# Patient Record
Sex: Female | Born: 1990 | Hispanic: Yes | Marital: Single | Smoking: Never smoker
Health system: Southern US, Community
[De-identification: ages and names within clinical notes are randomized; demographics above are authoritative.]

## PROBLEM LIST (undated history)

## (undated) DIAGNOSIS — Z803 Family history of malignant neoplasm of breast: Secondary | ICD-10-CM

## (undated) DIAGNOSIS — Z789 Other specified health status: Secondary | ICD-10-CM

## (undated) DIAGNOSIS — D649 Anemia, unspecified: Secondary | ICD-10-CM

## (undated) HISTORY — PX: NO PAST SURGERIES: SHX2092

## (undated) HISTORY — DX: Family history of malignant neoplasm of breast: Z80.3

## (undated) HISTORY — DX: Anemia, unspecified: D64.9

## (undated) HISTORY — PX: CHOLECYSTECTOMY: SHX55

---

## 2012-09-28 ENCOUNTER — Inpatient Hospital Stay (HOSPITAL_COMMUNITY): Admission: AD | Admit: 2012-09-28 | Payer: Self-pay | Source: Ambulatory Visit | Admitting: Obstetrics & Gynecology

## 2013-08-15 NOTE — L&D Delivery Note (Signed)
Delivery Note At 1:08 PM a viable and healthy female was delivered via Vaginal, Spontaneous Delivery (Presentation: Left Occiput Anterior).  APGAR: 9, 9; weight  pending.   Placenta status: Intact, Spontaneous.  Cord: 3 vessels with a loose nuchal x 1 reduced on perineum  Anesthesia: None  Episiotomy: None Lacerations: None Suture Repair: None Est. Blood Loss (mL):  250cc  Mom to postpartum.  Baby to Couplet care / Skin to Skin.  Careena Degraffenreid H. 07/31/2014, 1:23 PM

## 2014-07-01 LAB — OB RESULTS CONSOLE GBS: GBS: POSITIVE

## 2014-07-31 ENCOUNTER — Inpatient Hospital Stay (HOSPITAL_COMMUNITY)
Admission: AD | Admit: 2014-07-31 | Discharge: 2014-08-02 | DRG: 775 | Disposition: A | Discharge status: Home / Self Care | Payer: Medicaid Other | Source: Ambulatory Visit | Attending: Obstetrics and Gynecology | Admitting: Obstetrics and Gynecology

## 2014-07-31 ENCOUNTER — Encounter (HOSPITAL_COMMUNITY): Payer: Self-pay | Admitting: *Deleted

## 2014-07-31 DIAGNOSIS — O99824 Streptococcus B carrier state complicating childbirth: Principal | ICD-10-CM | POA: Diagnosis present

## 2014-07-31 DIAGNOSIS — O4290 Premature rupture of membranes, unspecified as to length of time between rupture and onset of labor, unspecified weeks of gestation: Secondary | ICD-10-CM | POA: Diagnosis present

## 2014-07-31 DIAGNOSIS — Z3A39 39 weeks gestation of pregnancy: Secondary | ICD-10-CM | POA: Diagnosis present

## 2014-07-31 DIAGNOSIS — Z3483 Encounter for supervision of other normal pregnancy, third trimester: Secondary | ICD-10-CM | POA: Diagnosis present

## 2014-07-31 HISTORY — DX: Other specified health status: Z78.9

## 2014-07-31 LAB — TYPE AND SCREEN
ABO/RH(D): O POS
Antibody Screen: NEGATIVE

## 2014-07-31 LAB — CBC
HCT: 32.9 % — ABNORMAL LOW (ref 36.0–46.0)
HEMOGLOBIN: 11.3 g/dL — AB (ref 12.0–15.0)
MCH: 29.6 pg (ref 26.0–34.0)
MCHC: 34.3 g/dL (ref 30.0–36.0)
MCV: 86.1 fL (ref 78.0–100.0)
Platelets: 264 10*3/uL (ref 150–400)
RBC: 3.82 MIL/uL — AB (ref 3.87–5.11)
RDW: 14 % (ref 11.5–15.5)
WBC: 7.9 10*3/uL (ref 4.0–10.5)

## 2014-07-31 LAB — OB RESULTS CONSOLE RUBELLA ANTIBODY, IGM: Rubella: IMMUNE

## 2014-07-31 LAB — OB RESULTS CONSOLE ABO/RH: RH TYPE: POSITIVE

## 2014-07-31 LAB — OB RESULTS CONSOLE ANTIBODY SCREEN: Antibody Screen: NEGATIVE

## 2014-07-31 LAB — OB RESULTS CONSOLE RPR: RPR: NONREACTIVE

## 2014-07-31 LAB — RPR

## 2014-07-31 LAB — POCT FERN TEST: POCT FERN TEST: POSITIVE

## 2014-07-31 LAB — OB RESULTS CONSOLE GC/CHLAMYDIA
Chlamydia: NEGATIVE
GC PROBE AMP, GENITAL: NEGATIVE

## 2014-07-31 LAB — ABO/RH: ABO/RH(D): O POS

## 2014-07-31 LAB — OB RESULTS CONSOLE HIV ANTIBODY (ROUTINE TESTING): HIV: NONREACTIVE

## 2014-07-31 LAB — OB RESULTS CONSOLE HEPATITIS B SURFACE ANTIGEN: Hepatitis B Surface Ag: NEGATIVE

## 2014-07-31 MED ORDER — CITRIC ACID-SODIUM CITRATE 334-500 MG/5ML PO SOLN: 30.0000 mL | ORAL | Status: DC | PRN

## 2014-07-31 MED ORDER — ONDANSETRON HCL 4 MG PO TABS: 4.0000 mg | ORAL_TABLET | ORAL | Status: DC | PRN

## 2014-07-31 MED ORDER — FENTANYL 2.5 MCG/ML BUPIVACAINE 1/10 % EPIDURAL INFUSION (WH - ANES): 14.0000 mL/h | INTRAMUSCULAR | Status: DC | PRN

## 2014-07-31 MED ORDER — OXYCODONE-ACETAMINOPHEN 5-325 MG PO TABS
2.0000 | ORAL_TABLET | ORAL | Status: DC | PRN
Start: 2014-07-31 — End: 2014-08-02

## 2014-07-31 MED ORDER — WITCH HAZEL-GLYCERIN EX PADS: 1.0000 "application " | MEDICATED_PAD | CUTANEOUS | Status: DC | PRN

## 2014-07-31 MED ORDER — OXYCODONE-ACETAMINOPHEN 5-325 MG PO TABS: 1.0000 | ORAL_TABLET | ORAL | Status: DC | PRN

## 2014-07-31 MED ORDER — SENNOSIDES-DOCUSATE SODIUM 8.6-50 MG PO TABS
2.0000 | ORAL_TABLET | ORAL | Status: DC
  Administered 2014-08-01 (×2): 2 via ORAL
  Filled 2014-07-31 (×2): qty 2

## 2014-07-31 MED ORDER — BUTORPHANOL TARTRATE 1 MG/ML IJ SOLN
1.0000 mg | INTRAMUSCULAR | Status: DC | PRN
  Administered 2014-07-31: 1 mg via INTRAVENOUS
  Filled 2014-07-31: qty 1

## 2014-07-31 MED ORDER — OXYTOCIN 40 UNITS IN LACTATED RINGERS INFUSION - SIMPLE MED: 62.5000 mL/h | INTRAVENOUS | Status: DC

## 2014-07-31 MED ORDER — OXYTOCIN 40 UNITS IN LACTATED RINGERS INFUSION - SIMPLE MED
1.0000 m[IU]/min | INTRAVENOUS | Status: DC
  Administered 2014-07-31: 2 m[IU]/min via INTRAVENOUS
  Administered 2014-07-31: 666 m[IU]/min via INTRAVENOUS
  Filled 2014-07-31: qty 1000

## 2014-07-31 MED ORDER — DIPHENHYDRAMINE HCL 25 MG PO CAPS: 25.0000 mg | ORAL_CAPSULE | Freq: Four times a day (QID) | ORAL | Status: DC | PRN

## 2014-07-31 MED ORDER — EPHEDRINE 5 MG/ML INJ
10.0000 mg | INTRAVENOUS | Status: DC | PRN
  Filled 2014-07-31: qty 2

## 2014-07-31 MED ORDER — ZOLPIDEM TARTRATE 5 MG PO TABS: 5.0000 mg | ORAL_TABLET | Freq: Every evening | ORAL | Status: DC | PRN

## 2014-07-31 MED ORDER — BENZOCAINE-MENTHOL 20-0.5 % EX AERO: 1.0000 "application " | INHALATION_SPRAY | CUTANEOUS | Status: DC | PRN

## 2014-07-31 MED ORDER — ONDANSETRON HCL 4 MG/2ML IJ SOLN: 4.0000 mg | Freq: Four times a day (QID) | INTRAMUSCULAR | Status: DC | PRN

## 2014-07-31 MED ORDER — ONDANSETRON HCL 4 MG/2ML IJ SOLN: 4.0000 mg | INTRAMUSCULAR | Status: DC | PRN

## 2014-07-31 MED ORDER — PRENATAL MULTIVITAMIN CH
1.0000 | ORAL_TABLET | Freq: Every day | ORAL | Status: DC
  Administered 2014-08-01 – 2014-08-02 (×2): 1 via ORAL
  Filled 2014-07-31 (×2): qty 1

## 2014-07-31 MED ORDER — TERBUTALINE SULFATE 1 MG/ML IJ SOLN: 0.2500 mg | Freq: Once | INTRAMUSCULAR | Status: DC | PRN

## 2014-07-31 MED ORDER — PENICILLIN G POTASSIUM 5000000 UNITS IJ SOLR
5.0000 10*6.[IU] | Freq: Once | INTRAVENOUS | Status: AC
  Administered 2014-07-31: 5 10*6.[IU] via INTRAVENOUS
  Filled 2014-07-31: qty 5

## 2014-07-31 MED ORDER — DIBUCAINE 1 % RE OINT: 1.0000 "application " | TOPICAL_OINTMENT | RECTAL | Status: DC | PRN

## 2014-07-31 MED ORDER — DIPHENHYDRAMINE HCL 50 MG/ML IJ SOLN: 12.5000 mg | INTRAMUSCULAR | Status: DC | PRN

## 2014-07-31 MED ORDER — PHENYLEPHRINE 40 MCG/ML (10ML) SYRINGE FOR IV PUSH (FOR BLOOD PRESSURE SUPPORT)
80.0000 ug | PREFILLED_SYRINGE | INTRAVENOUS | Status: DC | PRN
  Filled 2014-07-31: qty 2

## 2014-07-31 MED ORDER — SIMETHICONE 80 MG PO CHEW: 80.0000 mg | CHEWABLE_TABLET | ORAL | Status: DC | PRN

## 2014-07-31 MED ORDER — LACTATED RINGERS IV SOLN
INTRAVENOUS | Status: DC
  Administered 2014-07-31: 10:00:00 via INTRAVENOUS

## 2014-07-31 MED ORDER — LACTATED RINGERS IV SOLN
500.0000 mL | INTRAVENOUS | Status: DC | PRN
  Administered 2014-07-31: 500 mL via INTRAVENOUS

## 2014-07-31 MED ORDER — TETANUS-DIPHTH-ACELL PERTUSSIS 5-2.5-18.5 LF-MCG/0.5 IM SUSP: 0.5000 mL | Freq: Once | INTRAMUSCULAR | Status: DC

## 2014-07-31 MED ORDER — PHENYLEPHRINE 40 MCG/ML (10ML) SYRINGE FOR IV PUSH (FOR BLOOD PRESSURE SUPPORT)
80.0000 ug | PREFILLED_SYRINGE | INTRAVENOUS | Status: DC | PRN
Start: 2014-07-31 — End: 2014-07-31
  Filled 2014-07-31: qty 2

## 2014-07-31 MED ORDER — IBUPROFEN 600 MG PO TABS
600.0000 mg | ORAL_TABLET | Freq: Four times a day (QID) | ORAL | Status: DC
Start: 2014-07-31 — End: 2014-08-02
  Administered 2014-07-31 – 2014-08-02 (×9): 600 mg via ORAL
  Filled 2014-07-31 (×9): qty 1

## 2014-07-31 MED ORDER — LANOLIN HYDROUS EX OINT: TOPICAL_OINTMENT | CUTANEOUS | Status: DC | PRN

## 2014-07-31 MED ORDER — ACETAMINOPHEN 325 MG PO TABS: 650.0000 mg | ORAL_TABLET | ORAL | Status: DC | PRN

## 2014-07-31 MED ORDER — OXYCODONE-ACETAMINOPHEN 5-325 MG PO TABS: 2.0000 | ORAL_TABLET | ORAL | Status: DC | PRN

## 2014-07-31 MED ORDER — LIDOCAINE HCL (PF) 1 % IJ SOLN
30.0000 mL | INTRAMUSCULAR | Status: DC | PRN
  Filled 2014-07-31: qty 30

## 2014-07-31 MED ORDER — LACTATED RINGERS IV SOLN: 500.0000 mL | Freq: Once | INTRAVENOUS | Status: DC

## 2014-07-31 MED ORDER — PENICILLIN G POTASSIUM 5000000 UNITS IJ SOLR
2.5000 10*6.[IU] | INTRAVENOUS | Status: DC
  Filled 2014-07-31 (×4): qty 2.5

## 2014-07-31 MED ORDER — OXYTOCIN BOLUS FROM INFUSION: 500.0000 mL | INTRAVENOUS | Status: DC

## 2014-07-31 NOTE — Lactation Note (Signed)
This note was copied from the chart of Sabrina Dyer. Lactation Consultation Note  Patient Name: Sabrina Wende Creaseonya Shinsato UJWJX'BToday's Date: 07/31/2014 Reason for consult: Initial assessment of this mom and baby at 8 hours pp.  Mom is an experienced breastfeeding multipara.  She states that she breastfed her 2 older children for 6 months each without problems and reports that her newborn is latching well.  Mom states that she knows how to hand express her colostrum/milk.  LC encouraged frequent STS and cue feedings.  LATCH scores of 10 have been recorded by RN staff and baby has had multiple feedings of 15-45 minutes each. Mom encouraged to feed baby 8-12 times/24 hours and with feeding cues. LC encouraged review of Baby and Me pp 9, 14 and 20-25 for STS and BF information. LC provided [facility] brochure and reviewed [facility] services and list of community and web site resources.    Maternal Data Formula Feeding for Exclusion: No Has patient been taught Hand Expression?: Yes (mom informs LC that she already knows how to hand express) Does the patient have breastfeeding experience prior to this delivery?: Yes  Feeding Feeding Type: Breast Fed Length of feed: 15 min (per mom)  LATCH Score/Interventions            LATCH scores==10          Lactation Tools Discussed/Used   STS, cue feedings, hand expression  Consult Status Consult Status: Follow-up Date: 08/01/14 Follow-up type: In-patient    Warrick ParisianBryant, Melyna Huron [facility] 07/31/2014, 10:02 PM

## 2014-07-31 NOTE — H&P (Signed)
Sabrina Dyer is a 23 y.o. female presenting for leaking fluid  23 yo G3P2002 @ 39+4 presents with leaking fluid and was confirmed to be grossly ruptured. Her pregnancy has been uncomplicated to this point.  History OB History    Gravida Para Term Preterm AB TAB SAB Ectopic Multiple Living   3 2 2  0 0 0 0 0 0 2     Past Medical History  Diagnosis Date  . Medical history non-contributory    Past Surgical History  Procedure Laterality Date  . No past surgeries     Family History: family history is not on file. Social History:  reports that she has never smoked. She has never used smokeless tobacco. She reports that she does not drink alcohol or use illicit drugs.  Prenatal Transfer Tool  Maternal Diabetes: No, elevated 1 hr, 3hr WNL Genetic Screening: Normal Maternal Ultrasounds/Referrals: Normal Fetal Ultrasounds or other Referrals:  Normal Maternal Substance Abuse:  No Significant Maternal Medications:  None Significant Maternal Lab Results:  None Other Comments:  None  ROS  Dilation: 2.5 Effacement (%): 70 Station: 0, -1 Exam by:: Herma CarsonLindsay Lima, rn Blood pressure 109/66, pulse 74, temperature 98.2 F (36.8 C), temperature source Oral, resp. rate 18, height 5' (1.524 m), weight 61.689 kg (136 lb). Exam Physical Exam  Prenatal labs: ABO, Rh: O/Positive/-- (12/17 0000) Antibody: Negative (12/17 0000) Rubella: Immune (12/17 0000) RPR: Nonreactive (12/17 0000)  HBsAg: Negative (12/17 0000)  HIV: Non-reactive (12/17 0000)  GBS: Positive (11/17 0000)   Assessment/Plan: 1) Admit 2) Epidural on request 3) Pitocin augmentation prn 4) PCN for GBS   Sabrina Esty H. 07/31/2014, 11:46 AM

## 2014-07-31 NOTE — MAU Note (Signed)
Pt stated her water broke at 7:30 . Reports ctx q 10 min. Good fetal movment reported.

## 2014-07-31 NOTE — Progress Notes (Signed)
UR chart review completed.  

## 2014-08-01 LAB — CBC
HCT: 27.6 % — ABNORMAL LOW (ref 36.0–46.0)
Hemoglobin: 9.3 g/dL — ABNORMAL LOW (ref 12.0–15.0)
MCH: 29 pg (ref 26.0–34.0)
MCHC: 33.7 g/dL (ref 30.0–36.0)
MCV: 86 fL (ref 78.0–100.0)
PLATELETS: 221 10*3/uL (ref 150–400)
RBC: 3.21 MIL/uL — ABNORMAL LOW (ref 3.87–5.11)
RDW: 13.9 % (ref 11.5–15.5)
WBC: 11.9 10*3/uL — ABNORMAL HIGH (ref 4.0–10.5)

## 2014-08-01 NOTE — Lactation Note (Signed)
This note was copied from the chart of Sabrina Dyer. Lactation Consultation Note  Patient Name: Sabrina Dyer MBWGY'KToday's Date: 08/01/2014 Reason for consult: Follow-up assessment Mom reports baby is nursing well, denies questions or concerns. Encouraged to call if she would like LC assist.   Maternal Data    Feeding    LATCH Score/Interventions                      Lactation Tools Discussed/Used     Consult Status Consult Status: PRN Date: 08/02/14 Follow-up type: In-patient    Alfred LevinsGranger, Pierce Biagini Ann 08/01/2014, 11:24 AM

## 2014-08-01 NOTE — Progress Notes (Signed)
Post Partum Day 1 Subjective: no complaints, up ad lib, voiding, tolerating PO, + flatus and breastfeeding  Objective: Blood pressure 100/54, pulse 84, temperature 98.6 F (37 C), temperature source Oral, resp. rate 18, height 5' (1.524 m), weight 61.689 kg (136 lb), unknown if currently breastfeeding.  Physical Exam:  General: alert, cooperative and no distress Lochia: appropriate Uterine Fundus: firm DVT Evaluation: No evidence of DVT seen on physical exam. Negative Homan's sign. No cords or calf tenderness.   Recent Labs  07/31/14 1003  HGB 11.3*  HCT 32.9*    Assessment/Plan: Plan for discharge tomorrow and Breastfeeding   LOS: 1 day   Quinto Tippy [name] 08/01/2014, 9:45 AM

## 2014-08-02 MED ORDER — IBUPROFEN 600 MG PO TABS: 600.0000 mg | ORAL_TABLET | Freq: Four times a day (QID) | ORAL | Status: DC | PRN

## 2014-08-02 NOTE — Lactation Note (Addendum)
This note was copied from the chart of Sabrina Dyer. Lactation Consultation Note  Patient Name: Sabrina Dyer ZOXWR'UToday's Date: 08/02/2014 Reason for consult: Follow-up assessment;Hyperbilirubinemia Baby 46 hours of life. Mom states nursing going well, baby able to maintain a deep latch, and mom denies nipple discomfort. Mom states she nursed both her older children without any issues. Mom states one of her children was jaundiced and required phototherapy. Enc mom to call for assistance as needed. Enc mom to nurse often and mom aware of OP/BFSG and LC phone line assistance after D/C.   Maternal Data    Feeding Feeding Type: Breast Fed Length of feed: 10 min  LATCH Score/Interventions                      Lactation Tools Discussed/Used     Consult Status Consult Status: PRN    Geralynn OchsWILLIARD, Kalep Full 08/02/2014, 11:21 AM

## 2014-08-02 NOTE — Discharge Summary (Signed)
Obstetric Discharge Summary Reason for Admission: onset of labor SROM Prenatal Procedures: NST Intrapartum Procedures: spontaneous vaginal delivery Postpartum Procedures: none Complications-Operative and Postpartum: none HEMOGLOBIN  Date Value Ref Range Status  08/01/2014 9.3* 12.0 - 15.0 g/dL Final    Comment:    REPEATED TO VERIFY DELTA CHECK NOTED    HCT  Date Value Ref Range Status  08/01/2014 27.6* 36.0 - 46.0 % Final    Physical Exam:  Blood pressure 111/59, pulse 72, temperature 98 F (36.7 C), temperature source Oral, resp. rate 18, height 5' (1.524 m), weight 61.689 kg (136 lb), unknown if currently breastfeeding.  General: alert, cooperative and no distress Lochia: appropriate Uterine Fundus: firm DVT Evaluation: No evidence of DVT seen on physical exam. Negative Homan's sign. No cords or calf tenderness.  Discharge Diagnoses: Term Pregnancy-delivered  Discharge Information: Date: 08/02/2014 Activity: pelvic rest Diet: routine Medications: PNV and Ibuprofen Condition: stable Instructions: refer to practice specific booklet Discharge to: home   Newborn Data: Live born female  Birth Weight: 7 lb 10.2 oz (3465 g) APGAR: 9, 9  Home with mother.  Sabrina Dyer Sabrina Dyer 08/02/2014, 8:16 AM

## 2014-08-02 NOTE — Progress Notes (Signed)
Post Partum Day 2 Subjective: no complaints, up ad lib, voiding, tolerating PO and breast feeding  Objective: Blood pressure 111/59, pulse 72, temperature 98 F (36.7 C), temperature source Oral, resp. rate 18, height 5' (1.524 m), weight 61.689 kg (136 lb), unknown if currently breastfeeding.  Physical Exam:  General: alert, cooperative and no distress Lochia: appropriate Uterine Fundus: firm DVT Evaluation: No evidence of DVT seen on physical exam. Negative Homan's sign. No cords or calf tenderness.   Recent Labs  07/31/14 1003 08/01/14 0615  HGB 11.3* 9.3*  HCT 32.9* 27.6*    Assessment/Plan: Discharge home and Breastfeeding   LOS: 2 days   Trayquan Kolakowski [name] 08/02/2014, 8:14 AM

## 2018-03-11 DIAGNOSIS — G43009 Migraine without aura, not intractable, without status migrainosus: Secondary | ICD-10-CM | POA: Diagnosis not present

## 2018-03-11 DIAGNOSIS — H53149 Visual discomfort, unspecified: Secondary | ICD-10-CM | POA: Diagnosis not present

## 2018-03-11 DIAGNOSIS — Z6821 Body mass index (BMI) 21.0-21.9, adult: Secondary | ICD-10-CM | POA: Diagnosis not present

## 2018-03-11 DIAGNOSIS — R110 Nausea: Secondary | ICD-10-CM | POA: Diagnosis not present

## 2018-03-21 DIAGNOSIS — H3552 Pigmentary retinal dystrophy: Secondary | ICD-10-CM | POA: Diagnosis not present

## 2018-05-06 DIAGNOSIS — B373 Candidiasis of vulva and vagina: Secondary | ICD-10-CM | POA: Diagnosis not present

## 2018-05-06 DIAGNOSIS — Z3202 Encounter for pregnancy test, result negative: Secondary | ICD-10-CM | POA: Diagnosis not present

## 2018-05-06 DIAGNOSIS — R300 Dysuria: Secondary | ICD-10-CM | POA: Diagnosis not present

## 2018-06-14 DIAGNOSIS — H1045 Other chronic allergic conjunctivitis: Secondary | ICD-10-CM | POA: Diagnosis not present

## 2019-07-02 ENCOUNTER — Ambulatory Visit (INDEPENDENT_AMBULATORY_CARE_PROVIDER_SITE_OTHER): Payer: BC Managed Care – PPO | Admitting: Family Medicine

## 2019-07-02 ENCOUNTER — Ambulatory Visit
Admission: RE | Admit: 2019-07-02 | Discharge: 2019-07-02 | Disposition: A | Discharge status: Home / Self Care | Payer: BC Managed Care – PPO | Source: Ambulatory Visit | Attending: Family Medicine | Admitting: Family Medicine

## 2019-07-02 ENCOUNTER — Encounter: Payer: Self-pay | Admitting: Family Medicine

## 2019-07-02 ENCOUNTER — Other Ambulatory Visit: Payer: Self-pay

## 2019-07-02 ENCOUNTER — Ambulatory Visit: Payer: Self-pay | Admitting: Family Medicine

## 2019-07-02 VITALS — BP 120/80 | HR 97 | Temp 97.1°F | Resp 14 | Ht 60.0 in | Wt 122.1 lb

## 2019-07-02 DIAGNOSIS — G43109 Migraine with aura, not intractable, without status migrainosus: Secondary | ICD-10-CM | POA: Diagnosis not present

## 2019-07-02 DIAGNOSIS — D649 Anemia, unspecified: Secondary | ICD-10-CM | POA: Diagnosis not present

## 2019-07-02 DIAGNOSIS — R0789 Other chest pain: Secondary | ICD-10-CM | POA: Insufficient documentation

## 2019-07-02 DIAGNOSIS — Z7689 Persons encountering health services in other specified circumstances: Secondary | ICD-10-CM

## 2019-07-02 DIAGNOSIS — Z30433 Encounter for removal and reinsertion of intrauterine contraceptive device: Secondary | ICD-10-CM | POA: Diagnosis not present

## 2019-07-02 DIAGNOSIS — Z23 Encounter for immunization: Secondary | ICD-10-CM

## 2019-07-02 DIAGNOSIS — R072 Precordial pain: Secondary | ICD-10-CM | POA: Diagnosis not present

## 2019-07-02 NOTE — Progress Notes (Signed)
flu

## 2019-07-02 NOTE — Progress Notes (Signed)
Name: Sabrina Dyer   MRN: [id number]    DOB: 1991-05-15   Date:07/02/2019       Progress Note  Chief Complaint  Patient presents with  . Establish Care  . Chest Pain    tightness in chest and has to pop her chest-or feels like her back is locked up  . Contraception    is on birth control but its about to run out soon.      Subjective:   Sabrina Creaseonya Budden is a 28 y.o. female, presents to clinic to establish care  She complains of over 4 months of anterior chest wall popping and pain.  She has some soreness in her sternum, feels like discomfort and need to "pop" is  located to ribs/cartilage and anterior chest wall, says its "not her heart" she denies associated SOB cough wheeze or recent URI illness.  Chiropractor did work on her a few months ago and gave her exercizes and it felt better for a while, but it seemed to get worse recently.    IUD - mirena has been in since Dec 2015 or Jan 2016, she needs removal and replacement of [facility] and did 6 week follow up  Spotting recently 4 day periods that are regular G3003     Patient Active Problem List   Diagnosis Date Noted  . Amniotic fluid leaking 07/31/2014  . Spontaneous vaginal delivery 07/31/2014    Past Surgical History:  Procedure Laterality Date  . CHOLECYSTECTOMY    . NO PAST SURGERIES      Family History  Problem Relation Age of Onset  . Hypertension Mother   . Heart attack Mother   . Diabetes Mother   . COPD Maternal Grandmother   . Emphysema Maternal Grandmother   . Diabetes Paternal Grandfather     Social History   Socioeconomic History  . Marital status: Single    Spouse name: Not on file  . Number of children: 3  . Years of education: Not on file  . Highest education level: Some college, no degree  Occupational History  . Not on file  Social Needs  . Financial resource strain: Not hard at all  . Food insecurity    Worry: Never true    Inability: Never true  . Transportation needs    Medical: No    Non-medical: No  Tobacco Use  . Smoking status: Never Smoker  . Smokeless tobacco: Never Used  Substance and Sexual Activity  . Alcohol use: No  . Drug use: No  . Sexual activity: Yes    Birth control/protection: I.U.D.  Lifestyle  . Physical activity    Days per week: 0 days    Minutes per session: 0 min  . Stress: Only a little  Relationships  . Social connections    Talks on phone: More than three times a week    Gets together: More than three times a week    Attends religious service: Never    Active member of club or organization: No    Attends meetings of clubs or organizations: Never    Relationship status: Never married  . Intimate partner violence    Fear of current or ex partner: No    Emotionally abused: No    Physically abused: No    Forced sexual activity: No  Other Topics Concern  . Not on file  Social History Narrative   Has a 28 year old boy,  28 year old boy and daughter 3 years old.   Single mother     Current Outpatient Medications:  .  ibuprofen (ADVIL,MOTRIN) 600 MG tablet, Take 1 tablet (600 mg total) by mouth every 6 (six) hours as needed., Disp: 60 tablet, Rfl: 3 .  ferrous sulfate 325 (65 FE) MG tablet, Take 325 mg by mouth daily with breakfast., Disp: , Rfl:  .  Prenatal Vit-Fe Fumarate-FA (PRENATAL MULTIVITAMIN) TABS tablet, Take 1 tablet by mouth daily at 12 noon., Disp: , Rfl:   No Known Allergies  I personally reviewed active problem list, medication list, allergies, family history, social history, health maintenance, notes from last encounter, lab results, imaging with the patient/caregiver today.  Review of Systems  Constitutional: Negative.   HENT: Negative.   Eyes: Negative.   Respiratory: Negative.   Cardiovascular: Negative.   Gastrointestinal: Negative.   Endocrine: Negative.   Genitourinary: Negative.   Musculoskeletal: Negative.   Skin: Negative.    Allergic/Immunologic: Negative.   Neurological: Negative.   Hematological: Negative.   Psychiatric/Behavioral: Negative.   All other systems reviewed and are negative.    Objective:    Vitals:   07/02/19 1425  BP: 120/80  Pulse: 97  Resp: 14  Temp: (!) 97.1 F (36.2 C)  TempSrc: Temporal  SpO2: 98%  Weight: 122 lb 1.6 oz (55.4 kg)  Height: 5' (1.524 m)    Body mass index is 23.85 kg/m.  Physical Exam Vitals signs and nursing note reviewed.  Constitutional:      General: She is not in acute distress.    Appearance: Normal appearance. She is well-developed. She is not ill-appearing, toxic-appearing or diaphoretic.     Interventions: Face mask in place.  HENT:     Head: Normocephalic and atraumatic.     Right Ear: External ear normal.     Left Ear: External ear normal.  Eyes:     General: Lids are normal. No scleral icterus.       Right eye: No discharge.        Left eye: No discharge.     Conjunctiva/sclera: Conjunctivae normal.  Neck:     Musculoskeletal: Normal range of motion and neck supple.     Thyroid: No thyromegaly.     Vascular: No JVD.     Trachea: Phonation normal. No tracheal deviation.  Cardiovascular:     Rate and Rhythm: Normal rate and regular rhythm.     Pulses: Normal pulses.          Radial pulses are 2+ on the right side and 2+ on the left side.       Posterior tibial pulses are 2+ on the right side and 2+ on the left side.     Heart sounds: Normal heart sounds. Heart sounds not distant. No murmur. No friction rub. No gallop.   Pulmonary:     Effort: Pulmonary effort is normal. No tachypnea, accessory muscle usage or respiratory distress.     Breath sounds: Normal breath sounds. No stridor. No decreased breath sounds, wheezing, rhonchi or rales.  Chest:     Chest wall: No mass, deformity, tenderness or crepitus. There is no dullness to percussion.  Abdominal:     General: Bowel sounds are normal. There is no distension.     Palpations:  Abdomen is soft.     Tenderness: There is no abdominal tenderness. There is no guarding or rebound.  Musculoskeletal: Normal range of motion.        General: No  deformity.     Right lower leg: No edema.     Left lower leg: No edema.  Lymphadenopathy:     Cervical: No cervical adenopathy.  Skin:    General: Skin is warm and dry.     Capillary Refill: Capillary refill takes less than 2 seconds.     Coloration: Skin is not cyanotic, jaundiced or pale.     Findings: No rash.     Nails: There is no clubbing.   Neurological:     Mental Status: She is alert.     Motor: No abnormal muscle tone.     Gait: Gait normal.  Psychiatric:        Mood and Affect: Mood normal. Mood is not anxious.        Speech: Speech normal.        Behavior: Behavior normal. Behavior is not agitated.       PHQ2/9: Depression screen PHQ 2/9 07/02/2019  Decreased Interest 2  Down, Depressed, Hopeless 2  PHQ - 2 Score 4  Altered sleeping 2  Tired, decreased energy 3  Change in appetite 1  Feeling bad or failure about yourself  1  Trouble concentrating 3  Moving slowly or fidgety/restless 0  Suicidal thoughts 0  PHQ-9 Score 14  Difficult doing work/chores Extremely dIfficult    phq 9 is positive - pt was very late for new pt appointment, she was mostly concerned with her chest wall sx and we did not get to discuss her PHQ score or sx, she was encouraged to come back to have time to discuss with more time at subsequent OV.  She was given the option of what was most important to her to discuss or if she wanted to reschedule her appt for another day to have more time overall.   Fall Risk: Fall Risk  07/02/2019  Falls in the past year? 0  Number falls in past yr: 0  Injury with Fall? 0      Functional Status Survey: Is the patient deaf or have difficulty hearing?: No Does the patient have difficulty seeing, even when wearing glasses/contacts?: Yes Does the patient have difficulty concentrating,  remembering, or making decisions?: No Does the patient have difficulty walking or climbing stairs?: No Does the patient have difficulty dressing or bathing?: No Does the patient have difficulty doing errands alone such as visiting a doctor's office or shopping?: No    Assessment & Plan:   1. Anterior chest wall pain Fairly bizarre symptoms that have been ongoing for most of this year, I cannot reproduce any of them on exam I do not feel anything abnormal in her anterior chest wall there is no edema, tenderness, crepitus do not feel any movement in the chest wall with forceful palpation or with inspiration otherwise her pulmonary and cardiac exam is normal.  She has had some success with chiropractic before she may want to try that again, I offered to get x-rays to evaluate sternum and lungs and ribs overall - DG Sternum - DG Chest 2 View  2. Encounter for IUD removal and reinsertion Referral to establish with OBGYn - in [place] okay  3. Migraine with aura and without status migrainosus, not intractable Hx of, sx managed currently with OTC meds  4. Anemia, unspecified type Hx of anemia, when pt returns for CPE or next OV she will get labs to recheck  5. Encounter to establish care with new doctor Available records reviewed, social history, family history, personal medical surgical  history allergies and medications are reviewed today.  6. Needs flu shot done - Flu Vaccine QUAD 6+ mos PF IM (Fluarix Quad PF)   Return in about 3 months (around 10/02/2019) for Annual Physical, Routine follow-up.   Delsa Grana, PA-C 07/02/19 2:46 PM

## 2019-07-03 DIAGNOSIS — H3552 Pigmentary retinal dystrophy: Secondary | ICD-10-CM | POA: Diagnosis not present

## 2019-07-05 ENCOUNTER — Telehealth: Payer: Self-pay | Admitting: Obstetrics and Gynecology

## 2019-07-05 NOTE — Telephone Encounter (Signed)
Noted. Will order to arrive by apt date/time. 

## 2019-07-05 NOTE — Telephone Encounter (Signed)
Patient is schedule for 07/23/19 at 2 pm with [facility] for Mirena replacement

## 2019-07-09 ENCOUNTER — Encounter: Payer: Self-pay | Admitting: Family Medicine

## 2019-07-18 NOTE — Telephone Encounter (Signed)
Mirena reserved for this patient. 

## 2019-07-23 ENCOUNTER — Encounter: Payer: Self-pay | Admitting: Obstetrics and Gynecology

## 2019-07-23 ENCOUNTER — Other Ambulatory Visit (HOSPITAL_COMMUNITY)
Admission: RE | Admit: 2019-07-23 | Discharge: 2019-07-23 | Disposition: A | Discharge status: Home / Self Care | Payer: BC Managed Care – PPO | Source: Ambulatory Visit | Attending: Obstetrics and Gynecology | Admitting: Obstetrics and Gynecology

## 2019-07-23 ENCOUNTER — Ambulatory Visit (INDEPENDENT_AMBULATORY_CARE_PROVIDER_SITE_OTHER): Payer: BC Managed Care – PPO | Admitting: Obstetrics and Gynecology

## 2019-07-23 ENCOUNTER — Other Ambulatory Visit: Payer: Self-pay

## 2019-07-23 VITALS — BP 108/70 | Ht 60.0 in | Wt 122.0 lb

## 2019-07-23 DIAGNOSIS — Z113 Encounter for screening for infections with a predominantly sexual mode of transmission: Secondary | ICD-10-CM | POA: Insufficient documentation

## 2019-07-23 DIAGNOSIS — Z124 Encounter for screening for malignant neoplasm of cervix: Secondary | ICD-10-CM | POA: Diagnosis not present

## 2019-07-23 DIAGNOSIS — Z30433 Encounter for removal and reinsertion of intrauterine contraceptive device: Secondary | ICD-10-CM | POA: Diagnosis not present

## 2019-07-23 LAB — HM PAP SMEAR

## 2019-07-23 MED ORDER — LEVONORGESTREL 20 MCG/24HR IU IUD: 1.0000 | INTRAUTERINE_SYSTEM | Freq: Once | INTRAUTERINE | 0 refills | Status: AC

## 2019-07-23 NOTE — Patient Instructions (Addendum)
I value your feedback and entrusting us with your care. If you get a [facility] patient survey, I would appreciate you taking the time to let us know about your experience today. Thank you!  [facility] [phone]  Instructions after IUD insertion  Most women experience no significant problems after insertion of an IUD, however minor cramping and spotting for a few days is common. Cramps may be treated with ibuprofen 800mg every 8 hours or Tylenol 650 mg every 4 hours. Contact [facility] immediately if you experience any of the following symptoms during the next week: temperature >99.6 degrees, worsening pelvic pain, abdominal pain, fainting, unusually heavy vaginal bleeding, foul vaginal discharge, or if you think you have expelled the IUD.  Nothing inserted in the vagina for 48 hours. You will be scheduled for a follow up visit in approximately four weeks.  You should check monthly to be sure you can feel the IUD strings in the upper vagina. If you are having a monthly period, try to check after each period. If you cannot feel the IUD strings,  contact [facility] immediately so we can do an exam to determine if the IUD has been expelled.   Please use backup protection until we can confirm the IUD is in place.  Call [facility] if you are exposed to or diagnosed with a sexually transmitted infection, as we will need to discuss whether it is safe for you to continue using an IUD.   

## 2019-07-23 NOTE — Progress Notes (Signed)
   Chief Complaint  Patient presents with  . Contraception    Mirena removal/reinsertion     History of Present Illness:  Sabrina Dyer is a 28 y.o. that had a Mirena IUD placed approximately 5 years ago. Since that time, she denies dyspareunia, vaginal d/c, heavy bleeding. Had been having monthly menses since placement, lasting 4 days, no BTB, no dysmen, but having random bleeding now the past few months and with some cramping. Would like Mirena replacement.  No recent pap, ok to do STD testing per pt.    BP 108/70   Ht 5' (1.524 m)   Wt 122 lb (55.3 kg)   Breastfeeding No   BMI 23.83 kg/m   Pelvic exam:  Two IUD strings present seen coming from the cervical os. EGBUS, vaginal vault and cervix: within normal limits  IUD Removal Strings of IUD identified and grasped.  IUD removed without problem with ring forceps.  Pt tolerated this well.  IUD noted to be intact.  IUD Insertion Procedure Note Patient identified, informed consent performed, consent signed.   Discussed risks of irregular bleeding, cramping, infection, malpositioning or misplacement of the IUD outside the uterus which may require further procedure such as laparoscopy, risk of failure <1%. Time out was performed.    Speculum placed in the vagina.  Cervix visualized.  Cleaned with Betadine x 2.  Grasped anteriorly with a single tooth tenaculum.  Uterus sounded to 7.0 cm.   IUD placed per manufacturer's recommendations.  Strings trimmed to 3 cm. Tenaculum was removed, good hemostasis noted.  Patient tolerated procedure well.   ASSESSMENT:  Encounter for removal and reinsertion of intrauterine contraceptive device (IUD) - Plan: levonorgestrel (MIRENA) 20 MCG/24HR IUD  Cervical cancer screening - Plan: [facility] PAP  Screening for STD (sexually transmitted disease) - Plan: [facility] PAP   Meds ordered this encounter  Medications  . levonorgestrel (MIRENA) 20 MCG/24HR IUD    Sig: 1 Intra Uterine Device (1 each total) by  Intrauterine route once for 1 dose.    Dispense:  1 each    Refill:  0    Order Specific Question:   Supervising Provider    Answer:   Gae Dry [[id number]]     Plan:  Patient was given post-procedure instructions.  She was advised to have backup contraception for one week.   Call if you are having increasing pain, cramps or bleeding or if you have a fever greater than 100.4 degrees F., shaking chills, nausea or vomiting. Patient was also asked to check IUD strings periodically and follow up in 4 weeks for IUD check.  Return in about 4 weeks (around 08/20/2019) for IUD f/u.  Alicia B. Copland, PA-C 07/23/2019 2:50 PM

## 2019-07-24 DIAGNOSIS — R87612 Low grade squamous intraepithelial lesion on cytologic smear of cervix (LGSIL): Secondary | ICD-10-CM | POA: Diagnosis not present

## 2019-07-26 LAB — CYTOLOGY - PAP
Chlamydia: NEGATIVE
Comment: NEGATIVE
Comment: NORMAL
Neisseria Gonorrhea: NEGATIVE

## 2019-08-19 ENCOUNTER — Ambulatory Visit: Payer: BC Managed Care – PPO | Admitting: Obstetrics and Gynecology

## 2019-08-20 ENCOUNTER — Ambulatory Visit: Payer: BC Managed Care – PPO | Admitting: Obstetrics and Gynecology

## 2019-08-31 DIAGNOSIS — Z03818 Encounter for observation for suspected exposure to other biological agents ruled out: Secondary | ICD-10-CM | POA: Diagnosis not present

## 2019-09-04 ENCOUNTER — Telehealth: Payer: Self-pay

## 2019-09-04 NOTE — Telephone Encounter (Signed)
Pt calling; has appt Fri; was exposed to someone who is covid + on the 9th; pt tested negative on the 16th.  Does she need to postpone her appt?  [phone]

## 2019-09-05 NOTE — Telephone Encounter (Signed)
Routing to [facility] for protocol if patient needs to be schedule or appointment type changed to telephone

## 2019-09-05 NOTE — Telephone Encounter (Signed)
no

## 2019-09-06 ENCOUNTER — Ambulatory Visit: Payer: BC Managed Care – PPO | Admitting: Obstetrics and Gynecology

## 2019-09-06 NOTE — Telephone Encounter (Signed)
Patient reschedule to 10/07/19 with SDJ

## 2019-10-04 ENCOUNTER — Other Ambulatory Visit: Payer: Self-pay

## 2019-10-04 ENCOUNTER — Ambulatory Visit (INDEPENDENT_AMBULATORY_CARE_PROVIDER_SITE_OTHER): Payer: BC Managed Care – PPO | Admitting: Family Medicine

## 2019-10-04 ENCOUNTER — Encounter: Payer: Self-pay | Admitting: Family Medicine

## 2019-10-04 VITALS — BP 102/70 | HR 77 | Temp 97.5°F | Resp 16 | Ht 60.0 in | Wt 117.5 lb

## 2019-10-04 DIAGNOSIS — Z1329 Encounter for screening for other suspected endocrine disorder: Secondary | ICD-10-CM | POA: Diagnosis not present

## 2019-10-04 DIAGNOSIS — Z130 Encounter for screening for diseases of the blood and blood-forming organs and certain disorders involving the immune mechanism: Secondary | ICD-10-CM

## 2019-10-04 DIAGNOSIS — Z13228 Encounter for screening for other metabolic disorders: Secondary | ICD-10-CM

## 2019-10-04 DIAGNOSIS — D649 Anemia, unspecified: Secondary | ICD-10-CM | POA: Diagnosis not present

## 2019-10-04 DIAGNOSIS — Z0000 Encounter for general adult medical examination without abnormal findings: Secondary | ICD-10-CM

## 2019-10-04 DIAGNOSIS — Z13220 Encounter for screening for lipoid disorders: Secondary | ICD-10-CM

## 2019-10-04 MED ORDER — HYDROXYZINE HCL 25 MG PO TABS: ORAL_TABLET | ORAL | 1 refills | Status: DC

## 2019-10-04 NOTE — Progress Notes (Signed)
Patient: Sabrina Dyer, Female    DOB: April 18, 1991, 29 y.o.   MRN: [id number] Delsa Grana, PA-C Visit Date: 10/04/2019  Today's Provider: Delsa Grana, PA-C   Chief Complaint  Patient presents with  . Annual Exam   Subjective:   Annual physical exam:  Sabrina Dyer is a 29 y.o. female who presents today for complete physical exam:  Exercise/Activity:  Exercises every once in a while, almost every other day with her sister, yoga/floor exercises and stuff 15-20, went to a gym to check out gymnastics as well. Works long hours at [employer] Diet/nutrition:  In general she fixes most meals at home, sometimes gets dinner out/fast food Sleep: not sleeping really well, about 5-6 hours, trouble falling asleep - a lot on her mind, tries melatonin   She recently saw OBGYN for IUD removal and insertion, PAP was abnormal, LSIL - has follow up with Dr. Glennon Mac Since she got her IUD about 2 months ago, she has not had a period, no SE/concerns, pain, cramping  Past hx of anemia (5-6 years ago) Hgb was 9.3 not labs since.  Her chest wall/sternal popping is still the same, no change, she was going to see a chiropractor that had worked on it before and that helped it, but she has just been so busy that she hasn't had time to go.  Mood/anxiety - she feels like its quite a bit better than her first visit here about 3 months ago - she "goes in waves" shes a little tired, some trouble sleeping, trouble concentrating, but she doesn't feel down, depressed, sad, and she denies SI Depression: still dealing with depression, phq score is improving a little bit.   Phq 9 completed today by patient, was reviewed by me with patient in the room, score is  positive PHQ 2/9 Scores 10/04/2019 07/02/2019  PHQ - 2 Score 2 4  PHQ- 9 Score 7 14   Depression screen [facility] 2/9 10/04/2019 07/02/2019  Decreased Interest 1 2  Down, Depressed, Hopeless 1 2  PHQ - 2 Score 2 4  Altered sleeping 1 2  Tired, decreased energy 1  3  Change in appetite 0 1  Feeling bad or failure about yourself  0 1  Trouble concentrating 3 3  Moving slowly or fidgety/restless 0 0  Suicidal thoughts 0 0  PHQ-9 Score 7 14  Difficult doing work/chores Very difficult Extremely dIfficult    Alcohol screening:   Office Visit from 10/04/2019 in [facility]  AUDIT-C Score  1      Immunizations and Health Maintenance: Health Maintenance  Topic Date Due  . INFLUENZA VACCINE  11/13/2019 (Originally 03/16/2019)  . TETANUS/TDAP  01/21/2020  . PAP-Cervical Cytology Screening  07/22/2022  . PAP SMEAR-Modifier  07/22/2022  . HIV Screening  Completed  Hep C Screening: not indicated  STD testing and prevention (HIV/chl/gon/syphilis): testing just done with GYN One new partner in the past 2 1/2 years   Intimate partner violence:    Safe, denies abuse  Sexual History/Pain during Intercourse: none, not active now, recently Single  Menstrual History/LMP/Abnormal Bleeding: none since IUD No LMP recorded. (Menstrual status: IUD).  Incontinence Symptoms: mild rare sx after pregnancies  Breast cancer:  Last Mammogram: none yet due to age  BRCA gene screening:    Cervical cancer screening: per obgyn  Dads sisters 2 aunts had breast CA, none on mother side  Family hx of cancers - breast, ovarian, uterine, colon, no other family hx that she  knows of   Skin cancer:  Hx of skin CA -  NO Discussed atypical lesions   Colorectal cancer:   colonoscopy is n/a  Lung cancer:   Low Dose CT Chest recommended if Age 26-80 years, 30 pack-year currently smoking OR have quit w/in 15years. Patient does not qualify.   Social History   Tobacco Use  . Smoking status: Never Smoker  . Smokeless tobacco: Never Used  Substance Use Topics  . 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   Blood pressure/Hypertension: BP Readings from Last 3 Encounters:  10/04/19 102/70  07/23/19 108/70  07/02/19 120/80     Weight/Obesity: Wt Readings from Last 3 Encounters:  10/04/19 117 lb 8 oz (53.3 kg)  07/23/19 122 lb (55.3 kg)  07/02/19 122 lb 1.6 oz (55.4 kg)   BMI Readings from Last 3 Encounters:  10/04/19 22.95 kg/m  07/23/19 23.83 kg/m  07/02/19 23.85 kg/m     Lipids:  No results found for: CHOL No results found for: HDL No results found for: LDLCALC No results found for: TRIG No results found for: CHOLHDL No results found for: LDLDIRECT Based on the results of lipid panel his/her cardiovascular risk factor ( using [place] )  in the next 10 years is: The ASCVD Risk score Mikey Bussing DC Jr., et al., 2013) failed to calculate for the following reasons:   The 2013 ASCVD risk score is only valid for ages 69 to 54  Glucose:  No results found for: GLUCOSE, [place]    Office Visit from 10/04/2019 in [facility]  AUDIT-C Score  1     Depression: Phq 9 is  positive Depression screen [facility] 2/9 10/04/2019 07/02/2019  Decreased Interest 1 2  Down, Depressed, Hopeless 1 2  PHQ - 2 Score 2 4  Altered sleeping 1 2  Tired, decreased energy 1 3  Change in appetite 0 1  Feeling bad or failure about yourself  0 1  Trouble concentrating 3 3  Moving slowly or fidgety/restless 0 0  Suicidal thoughts 0 0  PHQ-9 Score 7 14  Difficult doing work/chores Very difficult Extremely dIfficult   Hypertension: BP Readings from Last 3 Encounters:  10/04/19 102/70  07/23/19 108/70  07/02/19 120/80   Obesity: Wt Readings from Last 3 Encounters:  10/04/19 117 lb 8 oz (53.3 kg)  07/23/19 122 lb (55.3 kg)  07/02/19 122 lb 1.6 oz (55.4 kg)   BMI Readings from Last 3 Encounters:  10/04/19 22.95 kg/m  07/23/19 23.83 kg/m  07/02/19 23.85 kg/m      Advanced Care Planning:  A voluntary discussion about advance care planning including the explanation and discussion of advance directives.    Social History      She  reports that she has never smoked. She has never used smokeless  tobacco. She reports that she does not drink alcohol or use drugs.       Social History   Socioeconomic History  . Marital status: Single    Spouse name: Not on file  . Number of children: 3  . Years of education: Not on file  . Highest education level: Some college, no degree  Occupational History  . Not on file  Tobacco Use  . Smoking status: Never Smoker  . Smokeless tobacco: Never Used  Substance and Sexual Activity  . Alcohol use: No  . Drug use: No  . Sexual activity: Yes    Birth control/protection: I.U.D.    Comment: Mirena  Other Topics Concern  . Not on file  Social History Narrative   Has a 29 year old boy, 29 year old boy and daughter 6 years old.   Single mother   Social Determinants of Health   Financial Resource Strain: Low Risk   . Difficulty of Paying Living Expenses: Not hard at all  Food Insecurity: No Food Insecurity  . Worried About Running Out of Food in the Last Year: Never true  . Ran Out of Food in the Last Year: Never true  Transportation Needs: No Transportation Needs  . Lack of Transportation (Medical): No  . Lack of Transportation (Non-Medical): No  Physical Activity: Inactive  . Days of Exercise per Week: 0 days  . Minutes of Exercise per Session: 0 min  Stress: No Stress Concern Present  . Feeling of Stress : Only a little  Social Connections: Moderately Isolated  . Frequency of Communication with Friends and Family: More than three times a week  . Frequency of Social Gatherings with Friends and Family: More than three times a week  . Attends Religious Services: Never  . Active Member of Clubs or Organizations: No  . Attends Club or Organization Meetings: Never  . Marital Status: Never married    Family History        Family Status  Relation Name Status  . Mother  Alive  . Father  Alive  . Daughter  Alive  . Son  Alive  . MGM  Alive       has been a smoker  . MGF  Alive  . PGM  Alive  . PGF  Alive  . Son  Alive  . Pat Aunt   Alive  . Pat Aunt  Alive        Her family history includes Breast cancer (age of onset: 45) in her paternal aunt and paternal aunt; COPD in her maternal grandmother; Diabetes in her mother and paternal grandfather; Emphysema in her maternal grandmother; Heart attack (age of onset: 50) in her mother; Hypertension in her mother.       Family History  Problem Relation Age of Onset  . Hypertension Mother   . Diabetes Mother   . Heart attack Mother 50  . COPD Maternal Grandmother   . Emphysema Maternal Grandmother   . Diabetes Paternal Grandfather   . Breast cancer Paternal Aunt 45       has contact  . Breast cancer Paternal Aunt 45       has contact    Patient Active Problem List   Diagnosis Date Noted  . Amniotic fluid leaking 07/31/2014  . Spontaneous vaginal delivery 07/31/2014    Past Surgical History:  Procedure Laterality Date  . CHOLECYSTECTOMY       Current Outpatient Medications:  .  levonorgestrel (MIRENA) 20 MCG/24HR IUD, 1 Intra Uterine Device (1 each total) by Intrauterine route once for 1 dose., Disp: 1 each, Rfl: 0  No Known Allergies  Patient Care Team: Tapia, [name], PA-C as PCP - General (Family Medicine)  Review of Systems  Constitutional: Negative.  Negative for activity change, appetite change, fatigue and unexpected weight change.  HENT: Negative.   Eyes: Negative.   Respiratory: Negative.  Negative for shortness of breath.   Cardiovascular: Negative.  Negative for chest pain, palpitations and leg swelling.  Gastrointestinal: Negative.  Negative for abdominal pain and blood in stool.  Endocrine: Negative.   Genitourinary: Negative.   Musculoskeletal: Negative.  Negative for arthralgias, gait problem, joint swelling   and myalgias.  Skin: Negative.  Negative for color change, pallor and rash.  Allergic/Immunologic: Negative.   Neurological: Negative.  Negative for syncope and weakness.  Hematological: Negative.   Psychiatric/Behavioral: Negative.   Negative for confusion, dysphoric mood, self-injury and suicidal ideas. The patient is not nervous/anxious.     I personally reviewed active problem list, medication list, allergies, family history, social history, health maintenance, notes from last encounter, lab results, imaging with the patient/caregiver today.        Objective:   Vitals:  Vitals:   10/04/19 1526  BP: 102/70  Pulse: 77  Resp: 16  Temp: (!) 97.5 F (36.4 C)  TempSrc: Temporal  SpO2: 99%  Weight: 117 lb 8 oz (53.3 kg)  Height: 5' (1.524 m)    Body mass index is 22.95 kg/m.  Physical Exam Vitals and nursing note reviewed.  Constitutional:      General: She is not in acute distress.    Appearance: Normal appearance. She is well-developed. She is not ill-appearing, toxic-appearing or diaphoretic.     Interventions: Face mask in place.  HENT:     Head: Normocephalic and atraumatic.     Right Ear: External ear normal.     Left Ear: External ear normal.  Eyes:     General: Lids are normal. No scleral icterus.       Right eye: No discharge.        Left eye: No discharge.     Conjunctiva/sclera: Conjunctivae normal.  Neck:     Trachea: Phonation normal. No tracheal deviation.  Cardiovascular:     Rate and Rhythm: Normal rate and regular rhythm.     Pulses: Normal pulses.          Radial pulses are 2+ on the right side and 2+ on the left side.       Posterior tibial pulses are 2+ on the right side and 2+ on the left side.     Heart sounds: Normal heart sounds. No murmur. No friction rub. No gallop.   Pulmonary:     Effort: Pulmonary effort is normal. No respiratory distress.     Breath sounds: Normal breath sounds. No stridor. No wheezing, rhonchi or rales.  Chest:     Chest wall: No tenderness.  Abdominal:     General: Bowel sounds are normal. There is no distension.     Palpations: Abdomen is soft.     Tenderness: There is no abdominal tenderness. There is no guarding or rebound.  Musculoskeletal:         General: No deformity. Normal range of motion.     Cervical back: Normal range of motion and neck supple.     Right lower leg: No edema.     Left lower leg: No edema.  Lymphadenopathy:     Cervical: No cervical adenopathy.  Skin:    General: Skin is warm and dry.     Capillary Refill: Capillary refill takes less than 2 seconds.     Coloration: Skin is not jaundiced or pale.     Findings: No rash.  Neurological:     Mental Status: She is alert and oriented to person, place, and time.     Motor: No abnormal muscle tone.     Gait: Gait normal.  Psychiatric:        Speech: Speech normal.        Behavior: Behavior normal.       Fall Risk: Fall Risk  10/04/2019 07/02/2019  Falls  in the past year? 0 0  Number falls in past yr: 0 0  Injury with Fall? 0 0    Functional Status Survey: Is the patient deaf or have difficulty hearing?: No Does the patient have difficulty seeing, even when wearing glasses/contacts?: No Does the patient have difficulty concentrating, remembering, or making decisions?: Yes Does the patient have difficulty walking or climbing stairs?: No Does the patient have difficulty dressing or bathing?: No Does the patient have difficulty doing errands alone such as visiting a doctor's office or shopping?: No   Assessment & Plan:    CPE completed today  . USPSTF grade A and B recommendations reviewed with patient; age-appropriate recommendations, preventive care, screening tests, etc discussed and encouraged; healthy living encouraged; see AVS for patient education given to patient  . Discussed importance of 150 minutes of physical activity weekly, AHA exercise recommendations given to pt in AVS/handout  . Discussed importance of healthy diet:  eating lean meats and proteins, avoiding trans fats and saturated fats, avoid simple sugars and excessive carbs in diet, eat 6 servings of fruit/vegetables daily and drink plenty of water and avoid sweet beverages.     . Recommended pt to do annual eye exam and routine dental exams/cleanings  . Depression, alcohol, fall screening completed as documented above and per flowsheets  . Reviewed Health Maintenance: Health Maintenance  Topic Date Due  . INFLUENZA VACCINE  11/13/2019 (Originally 03/16/2019)  . TETANUS/TDAP  01/21/2020  . PAP-Cervical Cytology Screening  07/22/2022  . PAP SMEAR-Modifier  07/22/2022  . HIV Screening  Completed    . Immunizations: Immunization History  Administered Date(s) Administered  . Tdap 01/20/2010      ICD-10-CM   1. Adult general medical exam  Z00.00 CBC with Differential/Platelet    COMPLETE METABOLIC PANEL WITH GFR    Lipid panel  2. Anemia, unspecified type  D64.9    hx of with pregnancies, recheck  3. Screening for endocrine, metabolic and immunity disorder  Z13.29 CBC with Differential/Platelet   O97.353 COMPLETE METABOLIC PANEL WITH GFR   Z13.0   4. Screening for lipoid disorders  G99.242 COMPLETE METABOLIC PANEL WITH GFR    Lipid panel  5. Screening for deficiency anemia  Z13.0 CBC with Differential/Platelet   phq positive, but improving, pt encouraged to come back if she wants to discuss moods/depression, mostly was trouble with concentration and moods are getting better.   Delsa Grana, PA-C 10/04/19 3:34 PM  [facility]

## 2019-10-04 NOTE — Patient Instructions (Signed)
Preventive Care 21-29 Years Old, Female Preventive care refers to visits with your health care provider and lifestyle choices that can promote health and wellness. This includes:  A yearly physical exam. This may also be called an annual well check.  Regular dental visits and eye exams.  Immunizations.  Screening for certain conditions.  Healthy lifestyle choices, such as eating a healthy diet, getting regular exercise, not using drugs or products that contain nicotine and tobacco, and limiting alcohol use. What can I expect for my preventive care visit? Physical exam Your health care provider will check your:  Height and weight. This may be used to calculate body mass index (BMI), which tells if you are at a healthy weight.  Heart rate and blood pressure.  Skin for abnormal spots. Counseling Your health care provider may ask you questions about your:  Alcohol, tobacco, and drug use.  Emotional well-being.  Home and relationship well-being.  Sexual activity.  Eating habits.  Work and work environment.  Method of birth control.  Menstrual cycle.  Pregnancy history. What immunizations do I need?  Influenza (flu) vaccine  This is recommended every year. Tetanus, diphtheria, and pertussis (Tdap) vaccine  You may need a Td booster every 10 years. Varicella (chickenpox) vaccine  You may need this if you have not been vaccinated. Human papillomavirus (HPV) vaccine  If recommended by your health care provider, you may need three doses over 6 months. Measles, mumps, and rubella (MMR) vaccine  You may need at least one dose of MMR. You may also need a second dose. Meningococcal conjugate (MenACWY) vaccine  One dose is recommended if you are age 19-21 years and a first-year college student living in a residence hall, or if you have one of several medical conditions. You may also need additional booster doses. Pneumococcal conjugate (PCV13) vaccine  You may need  this if you have certain conditions and were not previously vaccinated. Pneumococcal polysaccharide (PPSV23) vaccine  You may need one or two doses if you smoke cigarettes or if you have certain conditions. Hepatitis A vaccine  You may need this if you have certain conditions or if you travel or work in places where you may be exposed to hepatitis A. Hepatitis B vaccine  You may need this if you have certain conditions or if you travel or work in places where you may be exposed to hepatitis B. Haemophilus influenzae type b (Hib) vaccine  You may need this if you have certain conditions. You may receive vaccines as individual doses or as more than one vaccine together in one shot (combination vaccines). Talk with your health care provider about the risks and benefits of combination vaccines. What tests do I need?  Blood tests  Lipid and cholesterol levels. These may be checked every 5 years starting at age 20.  Hepatitis C test.  Hepatitis B test. Screening  Diabetes screening. This is done by checking your blood sugar (glucose) after you have not eaten for a while (fasting).  Sexually transmitted disease (STD) testing.  BRCA-related cancer screening. This may be done if you have a family history of breast, ovarian, tubal, or peritoneal cancers.  Pelvic exam and Pap test. This may be done every 3 years starting at age 21. Starting at age 30, this may be done every 5 years if you have a Pap test in combination with an HPV test. Talk with your health care provider about your test results, treatment options, and if necessary, the need for more tests.   Follow these instructions at home: Eating and drinking   Eat a diet that includes fresh fruits and vegetables, whole grains, lean protein, and low-fat dairy.  Take vitamin and mineral supplements as recommended by your health care provider.  Do not drink alcohol if: ? Your health care provider tells you not to drink. ? You are  pregnant, may be pregnant, or are planning to become pregnant.  If you drink alcohol: ? Limit how much you have to 0-1 drink a day. ? Be aware of how much alcohol is in your drink. In the [place] one drink equals one 12 oz bottle of beer (355 mL), one 5 oz glass of wine (148 mL), or one 1 oz glass of hard liquor (44 mL). Lifestyle  Take daily care of your teeth and gums.  Stay active. Exercise for at least 30 minutes on 5 or more days each week.  Do not use any products that contain nicotine or tobacco, such as cigarettes, e-cigarettes, and chewing tobacco. If you need help quitting, ask your health care provider.  If you are sexually active, practice safe sex. Use a condom or other form of birth control (contraception) in order to prevent pregnancy and STIs (sexually transmitted infections). If you plan to become pregnant, see your health care provider for a preconception visit. What's next?  Visit your health care provider once a year for a well check visit.  Ask your health care provider how often you should have your eyes and teeth checked.  Stay up to date on all vaccines. This information is not intended to replace advice given to you by your health care provider. Make sure you discuss any questions you have with your health care provider. Document Revised: 04/12/2018 Document Reviewed: 04/12/2018 [employer] Patient Education  2020 [employer] Managing Anxiety, Adult After being diagnosed with an anxiety disorder, you may be relieved to know why you have felt or behaved a certain way. You may also feel overwhelmed about the treatment ahead and what it will mean for your life. With care and support, you can manage this condition and recover from it. How to manage lifestyle changes Managing stress and anxiety  Stress is your body's reaction to life changes and events, both good and bad. Most stress will last just a few hours, but stress can be ongoing and can lead to more than just  stress. Although stress can play a major role in anxiety, it is not the same as anxiety. Stress is usually caused by something external, such as a deadline, test, or competition. Stress normally passes after the triggering event has ended.  Anxiety is caused by something internal, such as imagining a terrible outcome or worrying that something will go wrong that will devastate you. Anxiety often does not go away even after the triggering event is over, and it can become long-term (chronic) worry. It is important to understand the differences between stress and anxiety and to manage your stress effectively so that it does not lead to an anxious response. Talk with your health care provider or a counselor to learn more about reducing anxiety and stress. He or she may suggest tension reduction techniques, such as:  Music therapy. This can include creating or listening to music that you enjoy and that inspires you.  Mindfulness-based meditation. This involves being aware of your normal breaths while not trying to control your breathing. It can be done while sitting or walking.  Centering prayer. This involves focusing on a word, phrase,   or sacred image that means something to you and brings you peace.  Deep breathing. To do this, expand your stomach and inhale slowly through your nose. Hold your breath for 3-5 seconds. Then exhale slowly, letting your stomach muscles relax.  Self-talk. This involves identifying thought patterns that lead to anxiety reactions and changing those patterns.  Muscle relaxation. This involves tensing muscles and then relaxing them. Choose a tension reduction technique that suits your lifestyle and personality. These techniques take time and practice. Set aside 5-15 minutes a day to do them. Therapists can offer counseling and training in these techniques. The training to help with anxiety may be covered by some insurance plans. Other things you can do to manage stress and  anxiety include:  Keeping a stress/anxiety diary. This can help you learn what triggers your reaction and then learn ways to manage your response.  Thinking about how you react to certain situations. You may not be able to control everything, but you can control your response.  Making time for activities that help you relax and not feeling guilty about spending your time in this way.  Visual imagery and yoga can help you stay calm and relax.  Medicines Medicines can help ease symptoms. Medicines for anxiety include:  Anti-anxiety drugs.  Antidepressants. Medicines are often used as a primary treatment for anxiety disorder. Medicines will be prescribed by a health care provider. When used together, medicines, psychotherapy, and tension reduction techniques may be the most effective treatment. Relationships Relationships can play a big part in helping you recover. Try to spend more time connecting with trusted friends and family members. Consider going to couples counseling, taking family education classes, or going to family therapy. Therapy can help you and others better understand your condition. How to recognize changes in your anxiety Everyone responds differently to treatment for anxiety. Recovery from anxiety happens when symptoms decrease and stop interfering with your daily activities at home or work. This may mean that you will start to:  Have better concentration and focus. Worry will interfere less in your daily thinking.  Sleep better.  Be less irritable.  Have more energy.  Have improved memory. It is important to recognize when your condition is getting worse. Contact your health care provider if your symptoms interfere with home or work and you feel like your condition is not improving. Follow these instructions at home: Activity  Exercise. Most adults should do the following: ? Exercise for at least 150 minutes each week. The exercise should increase your heart rate  and make you sweat (moderate-intensity exercise). ? Strengthening exercises at least twice a week.  Get the right amount and quality of sleep. Most adults need 7-9 hours of sleep each night. Lifestyle   Eat a healthy diet that includes plenty of vegetables, fruits, whole grains, low-fat dairy products, and lean protein. Do not eat a lot of foods that are high in solid fats, added sugars, or salt.  Make choices that simplify your life.  Do not use any products that contain nicotine or tobacco, such as cigarettes, e-cigarettes, and chewing tobacco. If you need help quitting, ask your health care provider.  Avoid caffeine, alcohol, and certain over-the-counter cold medicines. These may make you feel worse. Ask your pharmacist which medicines to avoid. General instructions  Take over-the-counter and prescription medicines only as told by your health care provider.  Keep all follow-up visits as told by your health care provider. This is important. Where to find support You can   get help and support from these sources:  Self-help groups.  Online and community organizations.  A trusted spiritual leader.  Couples counseling.  Family education classes.  Family therapy. Where to find more information You may find that joining a support group helps you deal with your anxiety. The following sources can help you locate counselors or support groups near you:  Mental Health [place]: www.mentalhealthamerica.net  Anxiety and Depression Association of [place] (ADAA): www.adaa.org  National Alliance on Mental Illness (NAMI): www.nami.org Contact a health care provider if you:  Have a hard time staying focused or finishing daily tasks.  Spend many hours a day feeling worried about everyday life.  Become exhausted by worry.  Start to have headaches, feel tense, or have nausea.  Urinate more than normal.  Have diarrhea. Get help right away if you have:  A racing heart and shortness  of breath.  Thoughts of hurting yourself or others. If you ever feel like you may hurt yourself or others, or have thoughts about taking your own life, get help right away. You can go to your nearest emergency department or call:  Your local emergency services (911 in the [place]  A suicide crisis helpline, such as the National Suicide Prevention Lifeline at 1-800-273-8255. This is open 24 hours a day. Summary  Taking steps to learn and use tension reduction techniques can help calm you and help prevent triggering an anxiety reaction.  When used together, medicines, psychotherapy, and tension reduction techniques may be the most effective treatment.  Family, friends, and partners can play a big part in helping you recover from an anxiety disorder. This information is not intended to replace advice given to you by your health care provider. Make sure you discuss any questions you have with your health care provider. Document Revised: 01/01/2019 Document Reviewed: 01/01/2019 [employer] Patient Education  2020 [employer]  

## 2019-10-04 NOTE — Progress Notes (Deleted)
Patient: Sabrina Dyer, Female    DOB: [birth year], 29 y.o.   MRN: [id number] Delsa Grana, PA-C Visit Date: 10/04/2019  Today's Provider: Delsa Grana, PA-C   Chief Complaint  Patient presents with  . Annual Exam   Subjective:   Annual physical exam:  Sabrina Dyer is a 29 y.o. female who presents today for complete physical exam:  Exercise/Activity:  Exercises every once in a while, almost every other day with her sister, yoga/floor exercises and stuff 15-20, went to a gym to check out gymnastics as well. Works long hours at [employer] Diet/nutrition:  In general she fixes most meals at home, sometimes gets dinner out/fast food Sleep: not sleeping really well, about 5-6 hours, trouble falling asleep - a lot on her mind, tries melatonin    She recently saw OBGYN for IUD removal and insertion, PAP was abnormal, LSIL - has follow up with Dr. Glennon Mac Since she got her IUD about 2 months ago, she has not had a period, no SE/concerns, pain, cramping  Past hx of anemia (5-6 years ago) Hgb was 9.3 not labs since.  Her chest wall/sternal popping is still the same, no change, she was going to see a chiropractor that had worked on it before and that helped it, but she has just been so busy that she hasn't had time to go.  Mood/anxiety - she feels like its quite a bit better than her first visit here about 3 months ago - she "goes in waves" shes a little tired, some trouble sleeping, trouble concentrating, but she doesn't feel down, depressed, sad, and she denies SI  USPSTF grade A and B recommendations - reviewed and addressed today  Depression:  Phq 9 completed today by patient, was reviewed by me with patient in the room, score is  {Desc; negative/positive:13464::"negative"}, pt feels *** PHQ 2/9 Scores 10/04/2019 07/02/2019  PHQ - 2 Score 2 4  PHQ- 9 Score 7 14   Depression screen [facility] 2/9 10/04/2019 07/02/2019  Decreased Interest 1 2  Down, Depressed, Hopeless 1 2  PHQ - 2 Score 2 4   Altered sleeping 1 2  Tired, decreased energy 1 3  Change in appetite 0 1  Feeling bad or failure about yourself  0 1  Trouble concentrating 3 3  Moving slowly or fidgety/restless 0 0  Suicidal thoughts 0 0  PHQ-9 Score 7 14  Difficult doing work/chores Very difficult Extremely dIfficult   Alcohol screening:   [facility] Visit from 10/04/2019 in [facility]  AUDIT-C Score  1     Immunizations and Health Maintenance: Health Maintenance  Topic Date Due  . INFLUENZA VACCINE  11/13/2019 (Originally 03/16/2019)  . TETANUS/TDAP  01/21/2020  . PAP-Cervical Cytology Screening  07/22/2022  . PAP SMEAR-Modifier  07/22/2022  . HIV Screening  Completed    Hep C Screening: not indicated  STD testing and prevention (HIV/chl/gon/syphilis): testing just done with GYN One new partner in the past 2 1/2 years   Intimate partner violence:    Safe, denies abuse  Sexual History/Pain during Intercourse: Single  Menstrual History/LMP/Abnormal Bleeding: none since IUD No LMP recorded. (Menstrual status: IUD).  Incontinence Symptoms: mild rare sx after pregnancies  Breast cancer:  Last Mammogram: none yet due to age  BRCA gene screening:    Cervical cancer screening: per obgyn  Dads sisters 2 aunts had breast CA, none on mother side  Family hx of cancers - breast, ovarian, uterine, colon, no other family hx that  she knows of  Osteoporosis:   Discussed high calcium and vitamin D supplementation, weight bearing exercises Pt is *** supplementing with daily calcium/Vit D. *** Bone scan/dexa  Skin cancer:  Hx of skin CA -  NO Discussed atypical lesions   Colorectal cancer:   colonoscopy is n/a  Lung cancer:   Low Dose CT Chest recommended if Age 22-80 years, 30 pack-year currently smoking OR have quit w/in 15years. Patient {DOES NOT does:27190::"does not"} qualify.   Social History   Tobacco Use  . Smoking status: Never Smoker  . Smokeless tobacco: Never Used   Substance Use Topics  . Alcohol use: No     ECG: not done   Blood pressure/Hypertension: BP Readings from Last 3 Encounters:  10/04/19 102/70  07/23/19 108/70  07/02/19 120/80    Weight/Obesity: Wt Readings from Last 3 Encounters:  10/04/19 117 lb 8 oz (53.3 kg)  07/23/19 122 lb (55.3 kg)  07/02/19 122 lb 1.6 oz (55.4 kg)   BMI Readings from Last 3 Encounters:  10/04/19 22.95 kg/m  07/23/19 23.83 kg/m  07/02/19 23.85 kg/m     Lipids:  No results found for: CHOL No results found for: HDL No results found for: LDLCALC No results found for: TRIG No results found for: CHOLHDL No results found for: LDLDIRECT Based on the results of lipid panel his/her cardiovascular risk factor ( using [employer] )  in the next 10 years is: The ASCVD Risk score Mikey Bussing DC Jr., et al., 2013) failed to calculate for the following reasons:   The 2013 ASCVD risk score is only valid for ages 3 to 84  Glucose:  No results found for: GLUCOSE, [place]    Office Visit from 10/04/2019 in [facility]  AUDIT-C Score  1     Depression: Phq 9 is  {Desc; negative/positive:13464::"negative"} Depression screen [facility] 2/9 10/04/2019 07/02/2019  Decreased Interest 1 2  Down, Depressed, Hopeless 1 2  PHQ - 2 Score 2 4  Altered sleeping 1 2  Tired, decreased energy 1 3  Change in appetite 0 1  Feeling bad or failure about yourself  0 1  Trouble concentrating 3 3  Moving slowly or fidgety/restless 0 0  Suicidal thoughts 0 0  PHQ-9 Score 7 14  Difficult doing work/chores Very difficult Extremely dIfficult   Hypertension: BP Readings from Last 3 Encounters:  10/04/19 102/70  07/23/19 108/70  07/02/19 120/80   Obesity: Wt Readings from Last 3 Encounters:  10/04/19 117 lb 8 oz (53.3 kg)  07/23/19 122 lb (55.3 kg)  07/02/19 122 lb 1.6 oz (55.4 kg)   BMI Readings from Last 3 Encounters:  10/04/19 22.95 kg/m  07/23/19 23.83 kg/m  07/02/19 23.85 kg/m      Advanced Care  Planning:  A voluntary discussion about advance care planning including the explanation and discussion of advance directives.   Discussed health care proxy and Living will, and the patient was able to identify a health care proxy as ***.   Patient {DOES_DOES DJS:97026} have a living will at present time.   Social History      She  reports that she has never smoked. She has never used smokeless tobacco. She reports that she does not drink alcohol or use drugs.       Social History   Socioeconomic History  . Marital status: Single    Spouse name: Not on file  . Number of children: 3  . Years of education: Not on file  .  Highest education level: Some college, no degree  Occupational History  . Not on file  Tobacco Use  . Smoking status: Never Smoker  . Smokeless tobacco: Never Used  Substance and Sexual Activity  . Alcohol use: No  . Drug use: No  . Sexual activity: Yes    Birth control/protection: I.U.D.    Comment: Mirena  Other Topics Concern  . Not on file  Social History Narrative   Has a 29 year old boy, 29 year old boy and daughter 60 years old.   Single mother   Social Determinants of Health   Financial Resource Strain: Low Risk   . Difficulty of Paying Living Expenses: Not hard at all  Food Insecurity: No Food Insecurity  . Worried About Charity fundraiser in the Last Year: Never true  . Ran Out of Food in the Last Year: Never true  Transportation Needs: No Transportation Needs  . Lack of Transportation (Medical): No  . Lack of Transportation (Non-Medical): No  Physical Activity: Inactive  . Days of Exercise per Week: 0 days  . Minutes of Exercise per Session: 0 min  Stress: No Stress Concern Present  . Feeling of Stress : Only a little  Social Connections: Moderately Isolated  . Frequency of Communication with Friends and Family: More than three times a week  . Frequency of Social Gatherings with Friends and Family: More than three times a week  . Attends  Religious Services: Never  . Active Member of Clubs or Organizations: No  . Attends Archivist Meetings: Never  . Marital Status: Never married    Family History        Family Status  Relation Name Status  . Mother  Alive  . Father  Alive  . Daughter  Alive  . Son  Alive  . MGM  Alive       has been a smoker  . MGF  Alive  . PGM  Alive  . PGF  Alive  . Son  Alive  . [employer]  . Ethlyn Daniels  Alive        Her family history includes Breast cancer (age of onset: 71) in her paternal aunt and paternal aunt; COPD in her maternal grandmother; Diabetes in her mother and paternal grandfather; Emphysema in her maternal grandmother; Heart attack (age of onset: 48) in her mother; Hypertension in her mother.       Family History  Problem Relation Age of Onset  . Hypertension Mother   . Diabetes Mother   . Heart attack Mother 41  . COPD Maternal Grandmother   . Emphysema Maternal Grandmother   . Diabetes Paternal Grandfather   . Breast cancer Paternal Aunt 73       has contact  . Breast cancer Paternal Aunt 81       has contact    Patient Active Problem List   Diagnosis Date Noted  . Amniotic fluid leaking 07/31/2014  . Spontaneous vaginal delivery 07/31/2014    Past Surgical History:  Procedure Laterality Date  . CHOLECYSTECTOMY       Current Outpatient Medications:  .  levonorgestrel (MIRENA) 20 MCG/24HR IUD, 1 Intra Uterine Device (1 each total) by Intrauterine route once for 1 dose., Disp: 1 each, Rfl: 0  No Known Allergies  Patient Care Team: Delsa Grana, PA-C as PCP - General (Family Medicine)  Review of Systems   ***       Objective:   Vitals:  Vitals:   10/04/19 1526  BP: 102/70  Pulse: 77  Resp: 16  Temp: (!) 97.5 F (36.4 C)  TempSrc: Temporal  SpO2: 99%  Weight: 117 lb 8 oz (53.3 kg)  Height: 5' (1.524 m)    Body mass index is 22.95 kg/m.  Physical Exam    Fall Risk: Fall Risk  10/04/2019 07/02/2019  Falls in the past  year? 0 0  Number falls in past yr: 0 0  Injury with Fall? 0 0    Functional Status Survey: Is the patient deaf or have difficulty hearing?: No Does the patient have difficulty seeing, even when wearing glasses/contacts?: No Does the patient have difficulty concentrating, remembering, or making decisions?: Yes Does the patient have difficulty walking or climbing stairs?: No Does the patient have difficulty dressing or bathing?: No Does the patient have difficulty doing errands alone such as visiting a doctor's office or shopping?: No   Assessment & Plan:    CPE completed today  . USPSTF grade A and B recommendations reviewed with patient; age-appropriate recommendations, preventive care, screening tests, etc discussed and encouraged; healthy living encouraged; see AVS for patient education given to patient  . Discussed importance of 150 minutes of physical activity weekly, AHA exercise recommendations given to pt in AVS/handout  . Discussed importance of healthy diet:  eating lean meats and proteins, avoiding trans fats and saturated fats, avoid simple sugars and excessive carbs in diet, eat 6 servings of fruit/vegetables daily and drink plenty of water and avoid sweet beverages.    . Recommended pt to do annual eye exam and routine dental exams/cleanings  . Depression, alcohol, fall screening completed as documented above and per flowsheets  . Reviewed Health Maintenance: Health Maintenance  Topic Date Due  . INFLUENZA VACCINE  11/13/2019 (Originally 03/16/2019)  . TETANUS/TDAP  01/21/2020  . PAP-Cervical Cytology Screening  07/22/2022  . PAP SMEAR-Modifier  07/22/2022  . HIV Screening  Completed    . Immunizations: Immunization History  Administered Date(s) Administered  . Tdap 01/20/2010    ***  Orders Placed This Encounter  Procedures  . CBC with Differential/Platelet  . COMPLETE METABOLIC PANEL WITH GFR  . Lipid panel    Order Specific Question:   Has the patient  fasted?    Answer:   Farris Has, PA-C 10/04/19 3:46 PM  [facility]

## 2019-10-05 LAB — CBC WITH DIFFERENTIAL/PLATELET
Absolute Monocytes: 735 cells/uL (ref 200–950)
Basophils Absolute: 47 cells/uL (ref 0–200)
Basophils Relative: 0.6 %
Eosinophils Absolute: 166 cells/uL (ref 15–500)
Eosinophils Relative: 2.1 %
HCT: 36.5 % (ref 35.0–45.0)
Hemoglobin: 11.6 g/dL — ABNORMAL LOW (ref 11.7–15.5)
Lymphs Abs: 2465 cells/uL (ref 850–3900)
MCH: 27.2 pg (ref 27.0–33.0)
MCHC: 31.8 g/dL — ABNORMAL LOW (ref 32.0–36.0)
MCV: 85.7 fL (ref 80.0–100.0)
MPV: 9.3 fL (ref 7.5–12.5)
Monocytes Relative: 9.3 %
Neutro Abs: 4487 cells/uL (ref 1500–7800)
Neutrophils Relative %: 56.8 %
Platelets: 391 10*3/uL (ref 140–400)
RBC: 4.26 10*6/uL (ref 3.80–5.10)
RDW: 12.5 % (ref 11.0–15.0)
Total Lymphocyte: 31.2 %
WBC: 7.9 10*3/uL (ref 3.8–10.8)

## 2019-10-05 LAB — COMPLETE METABOLIC PANEL WITH GFR
AG Ratio: 1.8 (calc) (ref 1.0–2.5)
ALT: 8 U/L (ref 6–29)
AST: 17 U/L (ref 10–30)
Albumin: 4.2 g/dL (ref 3.6–5.1)
Alkaline phosphatase (APISO): 65 U/L (ref 31–125)
BUN: 21 mg/dL (ref 7–25)
CO2: 27 mmol/L (ref 20–32)
Calcium: 8.9 mg/dL (ref 8.6–10.2)
Chloride: 107 mmol/L (ref 98–110)
Creat: 0.7 mg/dL (ref 0.50–1.10)
GFR, Est African American: 137 mL/min/{1.73_m2} (ref >=60)
GFR, Est Non African American: 118 mL/min/{1.73_m2} (ref >=60)
Globulin: 2.4 g/dL (calc) (ref 1.9–3.7)
Glucose, Bld: 95 mg/dL (ref 65–99)
Potassium: 4.3 mmol/L (ref 3.5–5.3)
Sodium: 141 mmol/L (ref 135–146)
Total Bilirubin: 0.4 mg/dL (ref 0.2–1.2)
Total Protein: 6.6 g/dL (ref 6.1–8.1)

## 2019-10-05 LAB — LIPID PANEL
Cholesterol: 134 mg/dL (ref <200)
HDL: 48 mg/dL — ABNORMAL LOW (ref >=50)
LDL Cholesterol (Calc): 73 mg/dL (calc)
Non-HDL Cholesterol (Calc): 86 mg/dL (calc) (ref <130)
Total CHOL/HDL Ratio: 2.8 (calc) (ref <5.0)
Triglycerides: 45 mg/dL (ref <150)

## 2019-10-07 ENCOUNTER — Other Ambulatory Visit: Payer: Self-pay

## 2019-10-07 ENCOUNTER — Encounter: Payer: Self-pay | Admitting: Obstetrics and Gynecology

## 2019-10-07 ENCOUNTER — Other Ambulatory Visit (HOSPITAL_COMMUNITY)
Admission: RE | Admit: 2019-10-07 | Discharge: 2019-10-07 | Disposition: A | Discharge status: Home / Self Care | Payer: BC Managed Care – PPO | Source: Ambulatory Visit | Attending: Obstetrics and Gynecology | Admitting: Obstetrics and Gynecology

## 2019-10-07 ENCOUNTER — Ambulatory Visit (INDEPENDENT_AMBULATORY_CARE_PROVIDER_SITE_OTHER): Payer: BC Managed Care – PPO | Admitting: Obstetrics and Gynecology

## 2019-10-07 VITALS — BP 108/68 | HR 78 | Ht 60.0 in | Wt 121.0 lb

## 2019-10-07 DIAGNOSIS — Z30431 Encounter for routine checking of intrauterine contraceptive device: Secondary | ICD-10-CM

## 2019-10-07 DIAGNOSIS — N870 Mild cervical dysplasia: Secondary | ICD-10-CM | POA: Diagnosis not present

## 2019-10-07 DIAGNOSIS — N938 Other specified abnormal uterine and vaginal bleeding: Secondary | ICD-10-CM

## 2019-10-07 DIAGNOSIS — R87612 Low grade squamous intraepithelial lesion on cytologic smear of cervix (LGSIL): Secondary | ICD-10-CM | POA: Insufficient documentation

## 2019-10-07 NOTE — Progress Notes (Signed)
Referring Provider:  Althea Grimmer, PA  HPI:  Deem Marmol is a 29 y.o.  [phone]  who presents today for evaluation and management of abnormal cervical cytology.    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 Wende Crease presents for IUD string check.  She had a Mirena placed 2 months ago.  Since placement of her IUD she had a small amount of vaginal bleeding.  She denies cramping or discomfort.  She has not had intercourse since placement.  She has checked the strings.  She denies any fever, chills, nausea, vomiting, or other complaints.     ROS:  No issues  OB History  Gravida Para Term Preterm AB Living  3 3 3  0 0 3  SAB TAB Ectopic Multiple Live Births  0 0 0 0 3    # Outcome Date GA Lbr Len/2nd Weight Sex Delivery Anes PTL Lv  3 Term 07/31/14 [redacted]w[redacted]d 05:30 / 00:08 7 lb 10.2 oz (3.465 kg) M Vag-Spont None  LIV     Birth Comments: facial bruising  2 Term           1 Term             Past Medical History:  Diagnosis Date  . Anemia   . Medical history non-contributory     Past Surgical History:  Procedure Laterality Date  . CHOLECYSTECTOMY      SOCIAL HISTORY:  Social History   Substance and Sexual Activity  Alcohol Use No   Substance and Sexual Activity  Alcohol Use No     Substance and Sexual Activity  Drug Use No     Family History  Problem Relation Age of Onset  . Hypertension Mother   . Diabetes Mother   . Heart attack Mother 70  . COPD Maternal Grandmother   . Emphysema Maternal Grandmother   . Diabetes Paternal Grandfather   . Breast cancer Paternal Aunt 58       has contact  . Breast cancer Paternal Aunt 27       has contact    ALLERGIES:  Patient has no known allergies.  Current Outpatient Medications on File Prior to Visit  Medication Sig Dispense Refill  . hydrOXYzine (ATARAX/VISTARIL) 25 MG tablet 25 mg PO TID PRN for anxiety and/or 25-50 mg PO at bedtime PRN for insomnia 60 tablet 1  . levonorgestrel (MIRENA) 20 MCG/24HR IUD 1  Intra Uterine Device (1 each total) by Intrauterine route once for 1 dose. 1 each 0   No current facility-administered medications on file prior to visit.    Physical Exam: -Vitals:  BP 108/68   Pulse 78   Ht 5' (1.524 m)   Wt 121 lb (54.9 kg)   LMP 10/06/2019   BMI 23.63 kg/m  GEN: WD, WN, NAD.  A+ O x 3, good mood and affect. ABD:  NT, ND.  Soft, no masses.  No hernias noted.   Pelvic:   Vulva: Normal appearance.  No lesions.  Vagina: No lesions or abnormalities noted.  Support: Normal pelvic support.  Urethra No masses tenderness or scarring.  Meatus Normal size without lesions or prolapse.  Cervix: See below.  Anus: Normal exam.  No lesions.  Perineum: Normal exam.  No lesions.        Bimanual   Uterus: Normal size.  Non-tender.  Mobile.  AV.  Adnexae: No masses.  Non-tender to palpation.  Cul-de-sac: Negative for abnormality.   PROCEDURE: 1.  Urine Pregnancy Test:  not  done (IUD) 2.  Colposcopy performed with 4% acetic acid after verbal consent obtained                                         -Aceto-white Lesions Location(s): mild anteriorly and posteriorly              -Biopsy performed at 6 and 12 o'clock               -ECC indicated and performed: Yes.       -Biopsy sites made hemostatic with pressure, AgNO3, and/or Monsel's solution   -Satisfactory colposcopy: No.    -Evidence of Invasive cervical CA :  NO  - IUD strings noted and not trimmed as the length was appropriate  ASSESSMENT:  Amillia Biffle is a 29 y.o. [id number] here for  1. LGSIL on Pap smear of cervix   2. IUD check up   .  PLAN:  I discussed the grading system of pap smears and HPV high risk viral types.  We will discuss and base management after colpo results return.      Prentice Docker, MD  [facility], [facility] 10/07/2019  2:36 PM

## 2019-10-09 ENCOUNTER — Telehealth: Payer: Self-pay | Admitting: Obstetrics and Gynecology

## 2019-10-09 LAB — SURGICAL PATHOLOGY

## 2019-10-09 NOTE — Telephone Encounter (Signed)
Discussed results of CIN 1. Recommend repeat pap smear in 1 year.  Discussed importance of follow up to prevent cancer.  All questions answered.

## 2020-01-29 DIAGNOSIS — L910 Hypertrophic scar: Secondary | ICD-10-CM | POA: Diagnosis not present

## 2020-01-29 DIAGNOSIS — L700 Acne vulgaris: Secondary | ICD-10-CM | POA: Diagnosis not present

## 2020-02-13 ENCOUNTER — Encounter: Payer: Self-pay | Admitting: Family Medicine

## 2020-02-13 ENCOUNTER — Other Ambulatory Visit: Payer: Self-pay

## 2020-02-13 ENCOUNTER — Ambulatory Visit (INDEPENDENT_AMBULATORY_CARE_PROVIDER_SITE_OTHER): Payer: BC Managed Care – PPO | Admitting: Family Medicine

## 2020-02-13 VITALS — BP 108/72 | HR 83 | Temp 96.9°F | Resp 16 | Ht 61.0 in | Wt 126.2 lb

## 2020-02-13 DIAGNOSIS — J310 Chronic rhinitis: Secondary | ICD-10-CM | POA: Diagnosis not present

## 2020-02-13 DIAGNOSIS — J329 Chronic sinusitis, unspecified: Secondary | ICD-10-CM | POA: Diagnosis not present

## 2020-02-13 DIAGNOSIS — R1013 Epigastric pain: Secondary | ICD-10-CM

## 2020-02-13 LAB — POCT URINALYSIS DIPSTICK
Appearance: NORMAL
Bilirubin, UA: NEGATIVE
Blood, UA: NEGATIVE
Glucose, UA: NEGATIVE
Ketones, UA: 15
Nitrite, UA: NEGATIVE
Protein, UA: NEGATIVE
Spec Grav, UA: 1.015 (ref 1.010–1.025)
Urobilinogen, UA: 0.2 E.U./dL
pH, UA: 6.5 (ref 5.0–8.0)

## 2020-02-13 MED ORDER — PANTOPRAZOLE SODIUM 40 MG PO TBEC: 40.0000 mg | DELAYED_RELEASE_TABLET | Freq: Every day | ORAL | 3 refills | Status: DC

## 2020-02-13 NOTE — Progress Notes (Signed)
Patient ID: Sabrina Dyer, female    DOB: 02-Feb-1991, 29 y.o.   MRN: [id number]  PCP: Danelle Berry, PA-C  Chief Complaint  Patient presents with  . Abdominal Pain    Onset 1 month    Subjective:   Sabrina Dyer is a 29 y.o. female, presents to clinic with CC of the following:  Abdominal Pain This is a new problem. The current episode started more than 1 month ago. The onset quality is gradual. The problem occurs intermittently. Duration: sharp and also achy sometimes only lasts 10 min. The problem has been gradually worsening. The pain is located in the epigastric region. The pain is severe. The pain is aggravated by eating. She has tried nothing for the symptoms. The treatment provided no relief. Her past medical history is significant for abdominal surgery and gallstones. There is no history of colon cancer, Crohn's disease, GERD, irritable bowel syndrome, pancreatitis, PUD or ulcerative colitis.    Patient reports abdominal pain that is epigastric in nature, it comes and goes, sometimes is sharp and intermittently radiates to her back, does seem to be more present when laying down, late at night or first in the morning.  She denies any urinary symptoms, vaginal symptoms, vomiting, change of bowel movements.  She does feel full sometimes when eating, occasional nausea denies any bloating.  Taking deep breaths sometimes makes it worse, she reports normal bowel movements and normal passing of gas.  No significant or recent appetite changes, weight changes.  Denies any viral type symptoms denies any loose stool, recent travel, fever, chills, sweats  She also complains of some pain over her nose she reports that she had a lot of nasal drainage congestion and excessive sneezing when she recently traveled to [place] and back she denies any facial pain, headaches, fever, chills, sweats, postnasal drip, sore throat She was not sure if it was allergies but she did not try any medications      Patient Active Problem List   Diagnosis Date Noted  . Amniotic fluid leaking 07/31/2014  . Spontaneous vaginal delivery 07/31/2014      Current Outpatient Medications:  .  hydrOXYzine (ATARAX/VISTARIL) 25 MG tablet, 25 mg PO TID PRN for anxiety and/or 25-50 mg PO at bedtime PRN for insomnia (Patient not taking: Reported on 02/13/2020), Disp: 60 tablet, Rfl: 1 .  levonorgestrel (MIRENA) 20 MCG/24HR IUD, 1 Intra Uterine Device (1 each total) by Intrauterine route once for 1 dose., Disp: 1 each, Rfl: 0 .  pantoprazole (PROTONIX) 40 MG tablet, Take 1 tablet (40 mg total) by mouth daily., Disp: 30 tablet, Rfl: 3   No Known Allergies   Social History   Tobacco Use  . Smoking status: Never Smoker  . Smokeless tobacco: Never Used  Vaping Use  . Vaping Use: Never used  Substance Use Topics  . Alcohol use: No  . Drug use: No      Chart Review Today: I personally reviewed active problem list, medication list, allergies, family history, social history, health maintenance, notes from last encounter, lab results, imaging with the patient/caregiver today.   Review of Systems  Constitutional: Negative.   HENT: Negative.   Eyes: Negative.   Respiratory: Negative.   Cardiovascular: Negative.   Gastrointestinal: Positive for abdominal pain.  Endocrine: Negative.   Genitourinary: Negative.   Musculoskeletal: Negative.   Skin: Negative.   Allergic/Immunologic: Negative.   Neurological: Negative.   Hematological: Negative.   Psychiatric/Behavioral: Negative.   All other systems reviewed and are  negative.      Objective:   Vitals:   02/13/20 1448  BP: 108/72  Pulse: 83  Resp: 16  Temp: (!) 96.9 F (36.1 C)  SpO2: 98%  Weight: 126 lb 3.2 oz (57.2 kg)  Height: 5\' 1"  (1.549 m)    Body mass index is 23.85 kg/m.  Physical Exam Vitals and nursing note reviewed.  Constitutional:      General: She is not in acute distress.    Appearance: She is well-developed. She is not  ill-appearing, toxic-appearing or diaphoretic.  HENT:     Head: Normocephalic and atraumatic.     Right Ear: Hearing, tympanic membrane, ear canal and external ear normal.     Left Ear: Hearing, tympanic membrane, ear canal and external ear normal.     Nose: Nasal tenderness, mucosal edema and congestion present. No rhinorrhea.     Right Turbinates: Swollen.     Left Turbinates: Swollen.     Right Sinus: No maxillary sinus tenderness or frontal sinus tenderness.     Left Sinus: No maxillary sinus tenderness or frontal sinus tenderness.     Mouth/Throat:     Mouth: Mucous membranes are moist. Mucous membranes are not pale.     Pharynx: Uvula midline. No pharyngeal swelling, oropharyngeal exudate or uvula swelling.     Tonsils: No tonsillar abscesses.  Eyes:     General: No scleral icterus.       Right eye: No discharge.        Left eye: No discharge.     Conjunctiva/sclera: Conjunctivae normal.     Pupils: Pupils are equal, round, and reactive to light.  Neck:     Trachea: No tracheal deviation.  Cardiovascular:     Rate and Rhythm: Normal rate and regular rhythm.     Heart sounds: Normal heart sounds.  Pulmonary:     Effort: Pulmonary effort is normal. No respiratory distress.     Breath sounds: No stridor. No wheezing, rhonchi or rales.  Abdominal:     General: Abdomen is flat. Bowel sounds are normal. There is no distension or abdominal bruit.     Palpations: Abdomen is soft.     Tenderness: There is no abdominal tenderness. There is no right CVA tenderness, left CVA tenderness, guarding or rebound. Negative signs include Murphy's sign, Rovsing's sign and McBurney's sign.     Hernia: No hernia is present.  Musculoskeletal:        General: Normal range of motion.     Cervical back: Normal range of motion and neck supple.  Skin:    General: Skin is warm and dry.     Coloration: Skin is not jaundiced or pale.     Findings: No rash.  Neurological:     Mental Status: She is  alert.     Motor: No abnormal muscle tone.     Coordination: Coordination normal.  Psychiatric:        Mood and Affect: Mood normal.        Behavior: Behavior normal.     Results for orders placed or performed in visit on 02/13/20  POCT urinalysis dipstick  Result Value Ref Range   Color, UA yellow    Clarity, UA clear    Glucose, UA Negative Negative   Bilirubin, UA negative    Ketones, UA 15    Spec Grav, UA 1.015 1.010 - 1.025   Blood, UA negative    pH, UA 6.5 5.0 - 8.0   Protein, UA  Negative Negative   Urobilinogen, UA 0.2 0.2 or 1.0 E.U./dL   Nitrite, UA negative    Leukocytes, UA Moderate (2+) (A) Negative   Appearance normal    Odor none    Urinalysis reviewed and unremarkable      Assessment & Plan:   1. Epigastric pain Patient presents with 1 month of intermittent upper abdominal pain that seems to be sharp and coming and going, currently asymptomatic.  Only associated symptoms is some mild early siaity/fullness, nighttime and morning nausea and symptoms do seem to come at bedtime or first in the morning do suspect upper GI etiology.  We will do 2 to 4 weeks treatment with PPI, diet lifestyle changes for GERD reviewed and handout given. Unlikely to be infectious, she is status post cholecystectomy, she denies any constipation symptoms but may be gas pains?  She denies any urinary/vaginal symptoms or change in bowels - POCT urinalysis dipstick-reviewed unremarkable - pantoprazole (PROTONIX) 40 MG tablet; Take 1 tablet (40 mg total) by mouth daily.  Dispense: 30 tablet; Refill: 3  2. Rhinosinusitis Patient complains of sneezing a lot with a recent trip to [place] she now has some tenderness over the bridge of her nose, nasal mucosa is edematous and slightly erythematous, she has no sinus tenderness to palpation but the bridge of nose is mildly tender without any palpable abnormality or deformity, no reports of recent trauma. I do not feel that antibiotic is currently  indicated but I did review with her concerning signs and symptoms that she should follow-up with sudden worsening of sinus or facial pain or congestion with fever.  For now encouraged her to use antihistamine and steroid nasal spray   Follow-up in 1 month on GI symptoms   Danelle Berry, PA-C 02/13/20 3:22 PM

## 2020-02-13 NOTE — Patient Instructions (Addendum)
Try the protonix medicine, first thing in the morning and don't eat for 30-60 min Try this for 2 to 4 weeks and then let me know if its not improving, we will want to follow up and recheck you     Food Choices for Gastroesophageal Reflux Disease, Adult When you have gastroesophageal reflux disease (GERD), the foods you eat and your eating habits are very important. Choosing the right foods can help ease your discomfort. Think about working with a nutrition specialist (dietitian) to help you make good choices. What are tips for following this plan?  Meals  Choose healthy foods that are low in fat, such as fruits, vegetables, whole grains, low-fat dairy products, and lean meat, fish, and poultry.  Eat small meals often instead of 3 large meals a day. Eat your meals slowly, and in a place where you are relaxed. Avoid bending over or lying down until 2-3 hours after eating.  Avoid eating meals 2-3 hours before bed.  Avoid drinking a lot of liquid with meals.  Cook foods using methods other than frying. Bake, grill, or broil food instead.  Avoid or limit: ? Chocolate. ? Peppermint or spearmint. ? Alcohol. ? Pepper. ? Black and decaffeinated coffee. ? Black and decaffeinated tea. ? Bubbly (carbonated) soft drinks. ? Caffeinated energy drinks and soft drinks.  Limit high-fat foods such as: ? Fatty meat or fried foods. ? Whole milk, cream, butter, or ice cream. ? Nuts and nut butters. ? Pastries, donuts, and sweets made with butter or shortening.  Avoid foods that cause symptoms. These foods may be different for everyone. Common foods that cause symptoms include: ? Tomatoes. ? Oranges, lemons, and limes. ? Peppers. ? Spicy food. ? Onions and garlic. ? Vinegar. Lifestyle  Maintain a healthy weight. Ask your doctor what weight is healthy for you. If you need to lose weight, work with your doctor to do so safely.  Exercise for at least 30 minutes for 5 or more days each week, or  as told by your doctor.  Wear loose-fitting clothes.  Do not smoke. If you need help quitting, ask your doctor.  Sleep with the head of your bed higher than your feet. Use a wedge under the mattress or blocks under the bed frame to raise the head of the bed. Summary  When you have gastroesophageal reflux disease (GERD), food and lifestyle choices are very important in easing your symptoms.  Eat small meals often instead of 3 large meals a day. Eat your meals slowly, and in a place where you are relaxed.  Limit high-fat foods such as fatty meat or fried foods.  Avoid bending over or lying down until 2-3 hours after eating.  Avoid peppermint and spearmint, caffeine, alcohol, and chocolate. This information is not intended to replace advice given to you by your health care provider. Make sure you discuss any questions you have with your health care provider. Document Revised: 11/22/2018 Document Reviewed: 09/06/2016 Elsevier Patient Education  2020 [employer].  Get and try flonase/nasonex and zyrtec, claritin, or allegra

## 2020-02-16 DIAGNOSIS — J0100 Acute maxillary sinusitis, unspecified: Secondary | ICD-10-CM | POA: Diagnosis not present

## 2020-03-05 ENCOUNTER — Ambulatory Visit: Payer: Self-pay

## 2020-03-05 ENCOUNTER — Encounter: Payer: Self-pay | Admitting: Family Medicine

## 2020-03-05 ENCOUNTER — Telehealth (INDEPENDENT_AMBULATORY_CARE_PROVIDER_SITE_OTHER): Payer: BC Managed Care – PPO | Admitting: Family Medicine

## 2020-03-05 ENCOUNTER — Other Ambulatory Visit: Payer: Self-pay

## 2020-03-05 DIAGNOSIS — Z20822 Contact with and (suspected) exposure to covid-19: Secondary | ICD-10-CM | POA: Diagnosis not present

## 2020-03-05 DIAGNOSIS — R519 Headache, unspecified: Secondary | ICD-10-CM

## 2020-03-05 DIAGNOSIS — J069 Acute upper respiratory infection, unspecified: Secondary | ICD-10-CM | POA: Diagnosis not present

## 2020-03-05 MED ORDER — MECLIZINE HCL 25 MG PO TABS: 12.5000 mg | ORAL_TABLET | Freq: Three times a day (TID) | ORAL | 0 refills | Status: DC | PRN

## 2020-03-05 MED ORDER — SUMATRIPTAN SUCCINATE 25 MG PO TABS: ORAL_TABLET | ORAL | 0 refills | Status: DC

## 2020-03-05 NOTE — Progress Notes (Signed)
Name: Sabrina Dyer   MRN: [id number]    DOB: 09/29/1990   Date:03/05/2020       Progress Note  Subjective:    Chief Complaint  Chief Complaint  Patient presents with  . Dizziness    sweats  since yesterday  . Migraine    I connected with  Sabrina Dyer  on 03/05/20 at  3:00 PM EDT by a video enabled telemedicine application and verified that I am speaking with the correct person using two identifiers.  I discussed the limitations of evaluation and management by telemedicine and the availability of in person appointments. The patient expressed understanding and agreed to proceed. Staff also discussed with the patient that there may be a patient responsible charge related to this service. Patient Location: home Provider Location: [facility] Additional Individuals present:  none  Headache  This is a new problem. The current episode started yesterday. The problem occurs constantly. The problem has been gradually improving. Pain location: left eye and some to back of head. The pain quality is similar to prior headaches. The quality of the pain is described as throbbing. The pain is moderate. Associated symptoms include dizziness, phonophobia, photophobia and rhinorrhea. Pertinent negatives include no abdominal pain, abnormal behavior, anorexia, blurred vision, facial sweating, fever, hearing loss, insomnia, loss of balance, muscle aches, nausea, neck pain, numbness, scalp tenderness, sinus pressure, sore throat, tingling, tinnitus, visual change, vomiting or weakness. The symptoms are aggravated by noise, bright light and activity. She has tried acetaminophen for the symptoms. The treatment provided moderate relief. Her past medical history is significant for migraine headaches.   COVID testing [facility] [address]- PCR    Pt previously call the call center and reported her symptoms:  Sabrina Dyer. See triage note. Patient with headache dizziness runny nose chills/sweats. Virtual today. [facility]    Patient called stating that she has a migraine She states that she feels lightheaded has sweats, chills, and runny nose.  She states that she has [facility]. She states that her headache was relieved by taking tylenol. She states that she has no fever. She has noticed lightheadedness with headache  She has never experienced lightheadedness with her migraines and usually needs stronger medication for her headaches. She has not fallen or injured her head. Per protocol patient was scheduled virtual visit today with office help.Care advice was read to patient.  She verbalized understaning DT was completed Reason for Disposition . [1] MODERATE dizziness (e.g., interferes with normal activities) AND [2] has NOT been evaluated by physician for this  (Exception: dizziness caused by heat exposure, sudden standing, or poor fluid intake)  Answer Assessment - Initial Assessment Questions 1. DESCRIPTION: "Describe your dizziness."     Head feels light 2. LIGHTHEADED: "Do you feel lightheaded?" (e.g., somewhat faint, woozy, weak upon standing)     Woozy feeling sweats 3. VERTIGO: "Do you feel like either you or the room is spinning or tilting?" (i.e. vertigo)    No 4. SEVERITY: "How bad is it?"  "Do you feel like you are going to faint?" "Can you stand and walk?"   - MILD: Feels slightly dizzy, but walking normally.   - MODERATE: Feels very unsteady when walking, but not falling; interferes with normal activities (e.g., school, work) .   - SEVERE: Unable to walk without falling, or requires assistance to walk without falling; feels like passing out now.      When  pain in head has to hold on 5. ONSET:  "When did the dizziness begin?"     With migraine yesterday 6. AGGRAVATING FACTORS: "Does anything make it worse?" (e.g., standing, change in head position)     standing 7. HEART RATE: "Can you tell me your heart rate?" "How many  beats in 15 seconds?"  (Note: not all patients can do this)      HR 94 8. CAUSE: "What do you think is causing the dizziness?"    unsure 9. RECURRENT SYMPTOM: "Have you had dizziness before?" If Yes, ask: "When was the last time?" "What happened that time?"    never 10. OTHER SYMPTOMS: "Do you have any other symptoms?" (e.g., fever, chest pain, vomiting, diarrhea, bleeding)      Sweats, no fever, runny nose sometimes 11. PREGNANCY: "Is there any chance you are pregnant?" "When was your last menstrual period?"      No IUD  Protocols used: DIZZINESS [facility]    Patient Active Problem List   Diagnosis Date Noted  . Amniotic fluid leaking 07/31/2014  . Spontaneous vaginal delivery 07/31/2014    Social History   Tobacco Use  . Smoking status: Never Smoker  . Smokeless tobacco: Never Used  Substance Use Topics  . Alcohol use: No     Current Outpatient Medications:  .  pantoprazole (PROTONIX) 40 MG tablet, Take 1 tablet (40 mg total) by mouth daily., Disp: 30 tablet, Rfl: 3 .  spironolactone (ALDACTONE) 25 MG tablet, Take 25 mg by mouth daily., Disp: , Rfl:  .  hydrOXYzine (ATARAX/VISTARIL) 25 MG tablet, 25 mg PO TID PRN for anxiety and/or 25-50 mg PO at bedtime PRN for insomnia (Patient not taking: Reported on 02/13/2020), Disp: 60 tablet, Rfl: 1 .  levonorgestrel (MIRENA) 20 MCG/24HR IUD, 1 Intra Uterine Device (1 each total) by Intrauterine route once for 1 dose., Disp: 1 each, Rfl: 0  No Known Allergies  I personally reviewed active problem list, medication list, allergies, family history, social history, health maintenance, notes from last encounter, lab results, imaging with the patient/caregiver today.   Review of Systems  Constitutional: Negative.  Negative for fever.  HENT: Positive for rhinorrhea. Negative for hearing loss, sinus pressure, sore throat and tinnitus.   Eyes: Positive for photophobia. Negative for blurred vision.  Respiratory: Negative.     Cardiovascular: Negative.   Gastrointestinal: Negative.  Negative for abdominal pain, anorexia, nausea and vomiting.  Endocrine: Negative.   Genitourinary: Negative.   Musculoskeletal: Negative.  Negative for neck pain.  Skin: Negative.   Allergic/Immunologic: Negative.   Neurological: Positive for dizziness and headaches. Negative for tingling, weakness, numbness and loss of balance.  Hematological: Negative.   Psychiatric/Behavioral: Negative.  The patient does not have insomnia.   All other systems reviewed and are negative.     Objective:   Virtual encounter, vitals limited, only able to obtain the following Today's Vitals   03/05/20 1223  PainSc: 4    There is no height or weight on file to calculate BMI. Nursing Note and Vital Signs reviewed.  Physical Exam Vitals and nursing note reviewed.  Constitutional:      General: She is not in acute distress.    Appearance: Normal appearance. She is not ill-appearing or toxic-appearing.  Pulmonary:     Effort: No tachypnea or respiratory distress.  Neurological:     Mental Status: She is alert.     Cranial Nerves: No dysarthria or facial asymmetry.  Psychiatric:  Mood and Affect: Mood normal.        Behavior: Behavior normal.     PE limited by telephone encounter  No results found for this or any previous visit (from the past 72 hour(s)).  Assessment and Plan:     ICD-10-CM   1. Upper respiratory tract infection, unspecified type  J06.9    supportive and symptomatic tx, PCR test for COVID, quarantine until resulted and isolate if positive  2. Acute nonintractable headache, unspecified headache type  R51.9 SUMAtriptan (IMITREX) 25 MG tablet    meclizine (ANTIVERT) 25 MG tablet   similar to her hx of migraines. was severe 2 days ago, has improved some with tylenol, imitrix as needed and meclizine for dizziness, no red flags     -Red flags and when to present for emergency care or RTC including fever >101.98F,  chest pain, shortness of breath, new/worsening/un-resolving symptoms, reviewed with patient at time of visit. Follow up and care instructions discussed and provided in AVS. - I discussed the assessment and treatment plan with the patient. The patient was provided an opportunity to ask questions and all were answered. The patient agreed with the plan and demonstrated an understanding of the instructions.  I provided 20 minutes of non-face-to-face time during this encounter.  Danelle Berry, PA-C 03/05/20 3:17 PM

## 2020-03-05 NOTE — Telephone Encounter (Signed)
Patient called stating that she has a migraine She states that she feels lightheaded has sweats, chills, and runny nose.  She states that [facility]. She states that her headache was relieved by taking tylenol. She states that she has no fever. She has noticed lightheadedness with headache  She has never experienced lightheadedness with her migraines and usually needs stronger medication for her headaches. She has not fallen or injured her head. Per protocol patient was scheduled virtual visit today with office help.Care advice was read to patient.  She verbalized understaning DT was completed Reason for Disposition . [1] MODERATE dizziness (e.g., interferes with normal activities) AND [2] has NOT been evaluated by physician for this  (Exception: dizziness caused by heat exposure, sudden standing, or poor fluid intake)  Answer Assessment - Initial Assessment Questions 1. DESCRIPTION: "Describe your dizziness."     Head feels light 2. LIGHTHEADED: "Do you feel lightheaded?" (e.g., somewhat faint, woozy, weak upon standing)     Woozy feeling sweats 3. VERTIGO: "Do you feel like either you or the room is spinning or tilting?" (i.e. vertigo)    No 4. SEVERITY: "How bad is it?"  "Do you feel like you are going to faint?" "Can you stand and walk?"   - MILD: Feels slightly dizzy, but walking normally.   - MODERATE: Feels very unsteady when walking, but not falling; interferes with normal activities (e.g., school, work) .   - SEVERE: Unable to walk without falling, or requires assistance to walk without falling; feels like passing out now.      When pain in head has to hold on 5. ONSET:  "When did the dizziness begin?"     With migraine yesterday 6. AGGRAVATING FACTORS: "Does anything make it worse?" (e.g., standing, change in head position)     standing 7. HEART RATE: "Can you tell me your heart rate?" "How many beats in 15 seconds?"  (Note: not all  patients can do this)      HR 94 8. CAUSE: "What do you think is causing the dizziness?"    unsure 9. RECURRENT SYMPTOM: "Have you had dizziness before?" If Yes, ask: "When was the last time?" "What happened that time?"    never 10. OTHER SYMPTOMS: "Do you have any other symptoms?" (e.g., fever, chest pain, vomiting, diarrhea, bleeding)      Sweats, no fever, runny nose sometimes 11. PREGNANCY: "Is there any chance you are pregnant?" "When was your last menstrual period?"      No IUD  Protocols used: DIZZINESS [facility]

## 2020-03-07 ENCOUNTER — Encounter: Payer: Self-pay | Admitting: Family Medicine

## 2020-06-02 ENCOUNTER — Ambulatory Visit: Payer: Self-pay | Admitting: *Deleted

## 2020-06-02 NOTE — Telephone Encounter (Signed)
lvm for pt to call the office so we can get her scheduled for an earlier appt

## 2020-06-02 NOTE — Telephone Encounter (Signed)
Pt wanting call back re concern about issue in her vaginal area and wants to ask nurse a couple personal questions cb at [phone] Reports "bump" noted on right inner labia that is now draining clear yellow drainage. No pain or itching. No other bumps reported. Denies fever. No available appt noted until 06/09/20. appt made for 06/09/20. Care advise given. Patient verbalized understanding of care advise and to call back if symptoms worsen. Please advise if earlier appt available .  Reason for Disposition . Genital area looks infected (e.g., draining sore, spreading redness)  Answer Assessment - Initial Assessment Questions 1. APPEARANCE of SWELLING: "What does it look like?" (e.g., lymph node, insect bite, mole)     Bump noted on inner right labia  2. SIZE: "How large is the swelling?" (inches, cm or compare to coins)     na 3. LOCATION: "Where is the swelling located?"     Right inner labia area 4. ONSET: "When did the swelling start?"     na 5. PAIN: "Is it painful?" If Yes, ask: "How much?"     No pain 6. ITCH: "Does it itch?" If Yes, ask: "How much?"     No itching  7. CAUSE: "What do you think caused the swelling?"     unknown 8. OTHER SYMPTOMS: "Do you have any other symptoms?" (e.g., fever)     Draining clear to yellow drainage today  Answer Assessment - Initial Assessment Questions 1. SYMPTOM: "What's the main symptom you're concerned about?" (e.g., rash, itching, swelling, dryness)     Swelling "bump" on right inner labia  2. LOCATION: "Where is the  bump located?" (e.g., inside/outside, left/right)     Right inner labia  3. ONSET: "When did the  Bump   start?"     na 4. PAIN: "Is there any pain?" If Yes, ask: "How bad is it?" (Scale: 1-10; mild, moderate, severe)   -  MILD (1-3): doesn't interfere with normal activities    -  MODERATE (4-7): interferes with normal activities (e.g., work or school) or awakens from sleep     -  SEVERE (8-10): excruciating pain, unable to  do any normal activities     No pain 5. CAUSE: "What do you think is causing the symptoms?"     unknown 6. OTHER SYMPTOMS: "Do you have any other symptoms?" (e.g., fever, vaginal bleeding, pain with urination)     Drainage from bump today clear yellow drainage 7. PREGNANCY: "Is there any chance you are pregnant?" "When was your last menstrual period?"     no  Protocols used: VULVAR SYMPTOMS-A-AH, SKIN LUMP OR LOCALIZED SWELLING-A-AH

## 2020-06-03 ENCOUNTER — Ambulatory Visit: Payer: Self-pay | Admitting: *Deleted

## 2020-06-03 NOTE — Telephone Encounter (Signed)
Patient is calling to report she thinks she has a yeast infection. Patient states the bump on the labia is better- but she has white clumpy discharge. Advised treat with OTC yeats medication- if the monitor bump area and call back if it does not keep improving.  Reason for Disposition . Symptoms of a vaginal yeast infection (i.e., white, thick, cottage-cheese-like, itchy, not bad smelling discharge)  Answer Assessment - Initial Assessment Questions 1. DISCHARGE: "Describe the discharge." (e.g., white, yellow, green, gray, foamy, cottage cheese-like)     White clumpy 2. ODOR: "Is there a bad odor?"     no 3. ONSET: "When did the discharge begin?"     Saturday- increased 4. RASH: "Is there a rash in that area?" If Yes, ask: "Describe it." (e.g., redness, blisters, sores, bumps)     No rash 5. ABDOMINAL PAIN: "Are you having any abdominal pain?" If Yes, ask: "What does it feel like? " (e.g., crampy, dull, intermittent, constant)     no 6. ABDOMINAL PAIN SEVERITY: If present, ask: "How bad is it?"  (e.g., mild, moderate, severe)  - MILD - doesn't interfere with normal activities   - MODERATE - interferes with normal activities or awakens from sleep   - SEVERE - patient doesn't want to move (R/O peritonitis)      no 7. CAUSE: "What do you think is causing the discharge?" "Have you had the same problem before? What happened then?"     yeast 8. OTHER SYMPTOMS: "Do you have any other symptoms?" (e.g., fever, itching, vaginal bleeding, pain with urination, injury to genital area, vaginal foreign body)     no 9. PREGNANCY: "Is there any chance you are pregnant?" "When was your last menstrual period?"     No  Protocols used: VAGINAL DISCHARGE-A-AH

## 2020-06-09 ENCOUNTER — Ambulatory Visit (INDEPENDENT_AMBULATORY_CARE_PROVIDER_SITE_OTHER): Payer: BC Managed Care – PPO | Admitting: Family Medicine

## 2020-06-09 ENCOUNTER — Other Ambulatory Visit: Payer: Self-pay

## 2020-06-09 ENCOUNTER — Encounter: Payer: Self-pay | Admitting: Family Medicine

## 2020-06-09 ENCOUNTER — Other Ambulatory Visit (HOSPITAL_COMMUNITY)
Admission: RE | Admit: 2020-06-09 | Discharge: 2020-06-09 | Disposition: A | Discharge status: Home / Self Care | Payer: BC Managed Care – PPO | Source: Ambulatory Visit | Attending: Family Medicine | Admitting: Family Medicine

## 2020-06-09 VITALS — BP 110/70 | HR 77 | Temp 99.1°F | Resp 14 | Ht 61.0 in | Wt 126.5 lb

## 2020-06-09 DIAGNOSIS — B9689 Other specified bacterial agents as the cause of diseases classified elsewhere: Secondary | ICD-10-CM | POA: Insufficient documentation

## 2020-06-09 DIAGNOSIS — N949 Unspecified condition associated with female genital organs and menstrual cycle: Secondary | ICD-10-CM

## 2020-06-09 DIAGNOSIS — N761 Subacute and chronic vaginitis: Secondary | ICD-10-CM | POA: Insufficient documentation

## 2020-06-09 LAB — POCT URINALYSIS DIPSTICK
Bilirubin, UA: NEGATIVE
Glucose, UA: NEGATIVE
Ketones, UA: NEGATIVE
Nitrite, UA: NEGATIVE
Protein, UA: POSITIVE — AB
Spec Grav, UA: 1.02 (ref 1.010–1.025)
Urobilinogen, UA: 0.2 E.U./dL
pH, UA: 5 (ref 5.0–8.0)

## 2020-06-09 LAB — POCT URINE PREGNANCY: Preg Test, Ur: NEGATIVE

## 2020-06-09 MED ORDER — FLUCONAZOLE 150 MG PO TABS: 150.0000 mg | ORAL_TABLET | ORAL | 0 refills | Status: DC | PRN

## 2020-06-09 NOTE — Progress Notes (Signed)
Patient ID: Sabrina Dyer, female    DOB: 1991/03/25, 29 y.o.   MRN: [id number]  PCP: Danelle Berry, PA-C  Chief Complaint  Patient presents with  . Consult    Bump on inside of labia, yeast    Subjective:   Sabrina Dyer is a 29 y.o. female, presents to clinic with CC of the following:  HPI  Bump to inside of labia, onset about a week ago, another bump lower near vaginal opening, some itching and irritation tx with OTC meds New sexual partner in the past 6 months   There are no problems to display for this patient.     Current Outpatient Medications:  .  hydrOXYzine (ATARAX/VISTARIL) 25 MG tablet, 25 mg PO TID PRN for anxiety and/or 25-50 mg PO at bedtime PRN for insomnia, Disp: 60 tablet, Rfl: 1 .  meclizine (ANTIVERT) 25 MG tablet, Take 0.5-1 tablets (12.5-25 mg total) by mouth 3 (three) times daily as needed (for dizziness/vertigo symptoms)., Disp: 30 tablet, Rfl: 0 .  pantoprazole (PROTONIX) 40 MG tablet, Take 1 tablet (40 mg total) by mouth daily., Disp: 30 tablet, Rfl: 3 .  SUMAtriptan (IMITREX) 25 MG tablet, Take 25-50 mg PO at onset of headache and may repeat dose in 2 hours if still symptomatic.  Maximum daily dose 200 mg, Disp: 20 tablet, Rfl: 0 .  levonorgestrel (MIRENA) 20 MCG/24HR IUD, 1 Intra Uterine Device (1 each total) by Intrauterine route once for 1 dose., Disp: 1 each, Rfl: 0 .  spironolactone (ALDACTONE) 25 MG tablet, Take 25 mg by mouth daily. (Patient not taking: Reported on 06/09/2020), Disp: , Rfl:    No Known Allergies   Social History   Tobacco Use  . Smoking status: Never Smoker  . Smokeless tobacco: Never Used  Vaping Use  . Vaping Use: Never used  Substance Use Topics  . Alcohol use: No  . Drug use: No      Chart Review Today: I personally reviewed active problem list, medication list, allergies, family history, social history, health maintenance, notes from last encounter, lab results, imaging with the patient/caregiver  today.   Review of Systems 10 Systems reviewed and are negative for acute change except as noted in the HPI.     Objective:   Vitals:   06/09/20 1304  BP: 110/70  Pulse: 77  Resp: 14  Temp: 99.1 F (37.3 C)  TempSrc: Oral  SpO2: 99%  Weight: 126 lb 8 oz (57.4 kg)  Height: 5\' 1"  (1.549 m)    Body mass index is 23.9 kg/m.  Physical Exam Vitals and nursing note reviewed.  Constitutional:      Appearance: She is well-developed.  HENT:     Head: Normocephalic and atraumatic.     Nose: Nose normal.  Eyes:     General:        Right eye: No discharge.        Left eye: No discharge.     Conjunctiva/sclera: Conjunctivae normal.  Neck:     Trachea: No tracheal deviation.  Cardiovascular:     Rate and Rhythm: Normal rate.  Pulmonary:     Effort: Pulmonary effort is normal. No respiratory distress.     Breath sounds: No stridor.  Genitourinary:    Comments: Small non-tender nodule to left internal labia, no erythema Musculoskeletal:        General: Normal range of motion.  Skin:    Findings: No rash.  Neurological:     Mental Status: She is  alert.     Motor: No abnormal muscle tone.     Coordination: Coordination normal.  Psychiatric:        Behavior: Behavior normal.      Results for orders placed or performed in visit on 02/13/20  POCT urinalysis dipstick  Result Value Ref Range   Color, UA yellow    Clarity, UA clear    Glucose, UA Negative Negative   Bilirubin, UA negative    Ketones, UA 15    Spec Grav, UA 1.015 1.010 - 1.025   Blood, UA negative    pH, UA 6.5 5.0 - 8.0   Protein, UA Negative Negative   Urobilinogen, UA 0.2 0.2 or 1.0 E.U./dL   Nitrite, UA negative    Leukocytes, UA Moderate (2+) (A) Negative   Appearance normal    Odor none        Assessment & Plan:   1. Subacute vaginitis Clinically suspicious for yeast vaginitis - swab and tx - Cervicovaginal ancillary only - POCT urinalysis dipstick - fluconazole (DIFLUCAN) 150 MG  tablet; Take 1 tablet (150 mg total) by mouth every 3 (three) days as needed (for vaginal itching/yeast infection sx).  Dispense: 2 tablet; Refill: 0 - POCT urine pregnancy  2. Genital lesion, female Small non-tender non-fluctuant nodule - possible cyst?  Does not appear like an abscess, not consistent with genital herpes lesion      Danelle Berry, PA-C 06/09/20 1:31 PM

## 2020-06-09 NOTE — Patient Instructions (Signed)
Take the diflucan - one dose - to see if it helps the itching and irritation  We will call you with results  The bumps don't look concerning to me right now - I would follow up with OB-GYN if they change or worsen.   Vaginal Yeast Infection, Adult  Vaginal yeast infection is a condition that causes vaginal discharge as well as soreness, swelling, and redness (inflammation) of the vagina. This is a common condition. Some women get this infection frequently. What are the causes? This condition is caused by a change in the normal balance of the yeast (candida) and bacteria that live in the vagina. This change causes an overgrowth of yeast, which causes the inflammation. What increases the risk? The condition is more likely to develop in women who:  Take antibiotic medicines.  Have diabetes.  Take birth control pills.  Are pregnant.  Douche often.  Have a weak body defense system (immune system).  Have been taking steroid medicines for a long time.  Frequently wear tight clothing. What are the signs or symptoms? Symptoms of this condition include:  White, thick, creamy vaginal discharge.  Swelling, itching, redness, and irritation of the vagina. The lips of the vagina (vulva) may be affected as well.  Pain or a burning feeling while urinating.  Pain during sex. How is this diagnosed? This condition is diagnosed based on:  Your medical history.  A physical exam.  A pelvic exam. Your health care provider will examine a sample of your vaginal discharge under a microscope. Your health care provider may send this sample for testing to confirm the diagnosis. How is this treated? This condition is treated with medicine. Medicines may be over-the-counter or prescription. You may be told to use one or more of the following:  Medicine that is taken by mouth (orally).  Medicine that is applied as a cream (topically).  Medicine that is inserted directly into the vagina  (suppository). Follow these instructions at home:  Lifestyle  Do not have sex until your health care provider approves. Tell your sex partner that you have a yeast infection. That person should go to his or her health care provider and ask if they should also be treated.  Do not wear tight clothes, such as pantyhose or tight pants.  Wear breathable cotton underwear. General instructions  Take or apply over-the-counter and prescription medicines only as told by your health care provider.  Eat more yogurt. This may help to keep your yeast infection from returning.  Do not use tampons until your health care provider approves.  Try taking a sitz bath to help with discomfort. This is a warm water bath that is taken while you are sitting down. The water should only come up to your hips and should cover your buttocks. Do this 3-4 times per day or as told by your health care provider.  Do not douche.  If you have diabetes, keep your blood sugar levels under control.  Keep all follow-up visits as told by your health care provider. This is important. Contact a health care provider if:  You have a fever.  Your symptoms go away and then return.  Your symptoms do not get better with treatment.  Your symptoms get worse.  You have new symptoms.  You develop blisters in or around your vagina.  You have blood coming from your vagina and it is not your menstrual period.  You develop pain in your abdomen. Summary  Vaginal yeast infection is a condition  that causes discharge as well as soreness, swelling, and redness (inflammation) of the vagina.  This condition is treated with medicine. Medicines may be over-the-counter or prescription.  Take or apply over-the-counter and prescription medicines only as told by your health care provider.  Do not douche. Do not have sex or use tampons until your health care provider approves.  Contact a health care provider if your symptoms do not get  better with treatment or your symptoms go away and then return. This information is not intended to replace advice given to you by your health care provider. Make sure you discuss any questions you have with your health care provider. Document Revised: 03/01/2019 Document Reviewed: 12/18/2017 Elsevier Patient Education  2020 [employer].

## 2020-06-10 LAB — CERVICOVAGINAL ANCILLARY ONLY
Bacterial Vaginitis (gardnerella): POSITIVE — AB
Candida Glabrata: NEGATIVE
Candida Vaginitis: NEGATIVE
Chlamydia: NEGATIVE
Comment: NEGATIVE
Comment: NEGATIVE
Comment: NEGATIVE
Comment: NEGATIVE
Comment: NEGATIVE
Comment: NORMAL
Neisseria Gonorrhea: NEGATIVE
Trichomonas: NEGATIVE

## 2020-09-18 ENCOUNTER — Encounter: Payer: Self-pay | Admitting: Family Medicine

## 2020-09-28 ENCOUNTER — Ambulatory Visit: Payer: BC Managed Care – PPO | Admitting: Family Medicine

## 2020-10-08 ENCOUNTER — Ambulatory Visit (INDEPENDENT_AMBULATORY_CARE_PROVIDER_SITE_OTHER): Payer: BC Managed Care – PPO | Admitting: Obstetrics and Gynecology

## 2020-10-08 ENCOUNTER — Other Ambulatory Visit: Payer: Self-pay

## 2020-10-08 ENCOUNTER — Encounter: Payer: Self-pay | Admitting: Obstetrics and Gynecology

## 2020-10-08 ENCOUNTER — Other Ambulatory Visit (HOSPITAL_COMMUNITY)
Admission: RE | Admit: 2020-10-08 | Discharge: 2020-10-08 | Disposition: A | Discharge status: Home / Self Care | Payer: BC Managed Care – PPO | Source: Ambulatory Visit | Attending: Obstetrics and Gynecology | Admitting: Obstetrics and Gynecology

## 2020-10-08 VITALS — BP 126/74 | Ht 60.0 in | Wt 135.0 lb

## 2020-10-08 DIAGNOSIS — Z1339 Encounter for screening examination for other mental health and behavioral disorders: Secondary | ICD-10-CM

## 2020-10-08 DIAGNOSIS — R87612 Low grade squamous intraepithelial lesion on cytologic smear of cervix (LGSIL): Secondary | ICD-10-CM | POA: Diagnosis not present

## 2020-10-08 DIAGNOSIS — Z1331 Encounter for screening for depression: Secondary | ICD-10-CM | POA: Diagnosis not present

## 2020-10-08 DIAGNOSIS — Z01419 Encounter for gynecological examination (general) (routine) without abnormal findings: Secondary | ICD-10-CM | POA: Insufficient documentation

## 2020-10-08 NOTE — Progress Notes (Signed)
Gynecology Annual Exam  PCP: Danelle Berry, PA-C  Chief Complaint  Patient presents with  . Annual Exam   History of Present Illness:  [name] is a 30 y.o. [id number] who LMP was Patient's last menstrual period was 09/24/2020., presents today for her annual examination.  Her menses are rare with her Mirena IUD.  She is sexually active.  .  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 There is no FH of ovarian cancer. The patient does do self-breast exams.  Tobacco use: The patient denies current or previous tobacco use. Alcohol use: social drinker Exercise: 4 days per week, weight lifting, cardio  The patient wears seatbelts: yes.   The patient reports that domestic violence in her life is absent.   Past Medical History:  Diagnosis Date  . Anemia   . Medical history non-contributory     Past Surgical History:  Procedure Laterality Date  . CHOLECYSTECTOMY      Prior to Admission medications   Medication Sig Start Date End Date Taking? Authorizing Provider  hydrOXYzine (ATARAX/VISTARIL) 25 MG tablet 25 mg PO TID PRN for anxiety and/or 25-50 mg PO at bedtime PRN for insomnia 10/04/19  Yes Danelle Berry, PA-C  pantoprazole (PROTONIX) 40 MG tablet Take 1 tablet (40 mg total) by mouth daily. 02/13/20  Yes Danelle Berry, PA-C  SUMAtriptan (IMITREX) 25 MG tablet Take 25-50 mg PO at onset of headache and may repeat dose in 2 hours if still symptomatic.  Maximum daily dose 200 mg 03/05/20  Yes Danelle Berry, PA-C  levonorgestrel (MIRENA) 20 MCG/24HR IUD 1 Intra Uterine Device (1 each total) by Intrauterine route once for 1 dose. 07/23/19 07/23/19  Copland, Ilona Sorrel, PA-C   Allergies: No Known Allergies  Obstetric History: [id number]  Social History   Socioeconomic History  . Marital  status: Single    Spouse name: Not on file  . Number of children: 3  . Years of education: Not on file  . Highest education level: Some college, no degree  Occupational History  . Not on file  Tobacco Use  . Smoking status: Never Smoker  . Smokeless tobacco: Never Used  Vaping Use  . Vaping Use: Never used  Substance and Sexual Activity  . Alcohol use: No  . Drug use: No  . Sexual activity: Yes    Birth control/protection: I.U.D.    Comment: Mirena  Other Topics Concern  . Not on file  Social History Narrative   Has a 30 year old boy, 30 year old boy and daughter 75 years old.   Single mother   Social Determinants of Health   Financial Resource Strain: Not on file  Food Insecurity: Not on file  Transportation Needs: Not on file  Physical Activity: Not on file  Stress: Not on file  Social Connections: Not on file  Intimate Partner Violence: Not on file    Family History  Problem Relation Age of Onset  . Hypertension Mother   . Diabetes Mother   . Heart attack Mother 37  . COPD Maternal Grandmother   . Emphysema Maternal Grandmother   . Diabetes Paternal Grandfather   . Breast cancer Paternal Aunt 60       has contact  . Breast cancer Paternal Aunt 55  has contact    Review of Systems  Constitutional: Negative.   HENT: Negative.   Eyes: Negative.   Respiratory: Negative.   Cardiovascular: Negative.   Gastrointestinal: Negative.   Genitourinary: Negative.   Musculoskeletal: Negative.   Skin: Negative.   Neurological: Negative.   Psychiatric/Behavioral: Negative.      Physical Exam BP 126/74   Ht 5' (1.524 m)   Wt 135 lb (61.2 kg)   LMP 09/24/2020   BMI 26.37 kg/m    Physical Exam Constitutional:      General: She is not in acute distress.    Appearance: Normal appearance. She is well-developed.  Genitourinary:     Vulva and bladder normal.     Right Labia: No rash, tenderness, lesions, skin changes or Bartholin's cyst.    Left Labia: No  tenderness, lesions, skin changes, Bartholin's cyst or rash.    No inguinal adenopathy present in the right or left side.    Pelvic Tanner Score: 5/5.    No vaginal discharge, erythema, tenderness or bleeding.      Right Adnexa: not tender, not full and no mass present.    Left Adnexa: not tender, not full and no mass present.    No cervical motion tenderness, discharge, lesion or polyp.     IUD strings visualized.     Uterus is not enlarged or tender.     No uterine mass detected.    Pelvic exam was performed with patient in the lithotomy position.  Breasts:     Right: No inverted nipple, mass, nipple discharge, skin change or tenderness.     Left: No inverted nipple, mass, nipple discharge, skin change or tenderness.    HENT:     Head: Normocephalic and atraumatic.  Eyes:     General: No scleral icterus.    Conjunctiva/sclera: Conjunctivae normal.  Neck:     Thyroid: No thyromegaly.  Cardiovascular:     Rate and Rhythm: Normal rate and regular rhythm.     Heart sounds: No murmur heard. No friction rub. No gallop.   Pulmonary:     Effort: Pulmonary effort is normal. No respiratory distress.     Breath sounds: Normal breath sounds. No wheezing or rales.  Abdominal:     General: Bowel sounds are normal. There is no distension.     Palpations: Abdomen is soft. There is no mass.     Tenderness: There is no abdominal tenderness. There is no guarding or rebound.     Hernia: There is no hernia in the left inguinal area or right inguinal area.  Musculoskeletal:        General: No swelling or tenderness. Normal range of motion.     Cervical back: Normal range of motion and neck supple.  Lymphadenopathy:     Cervical: No cervical adenopathy.     Lower Body: No right inguinal adenopathy. No left inguinal adenopathy.  Neurological:     General: No focal deficit present.     Mental Status: She is alert and oriented to person, place, and time.     Cranial Nerves: No cranial nerve  deficit.  Skin:    General: Skin is warm and dry.     Findings: No erythema or rash.  Psychiatric:        Mood and Affect: Mood normal.        Behavior: Behavior normal.        Judgment: Judgment normal.     Female chaperone present for pelvic and breast  portions of the physical exam  Results: AUDIT Questionnaire (screen for alcoholism): 3 PHQ-9: 4   Assessment: 30 y.o. [id number] female here for routine annual gynecologic examination  Plan: Problem List Items Addressed This Visit   None   Visit Diagnoses    Women's annual routine gynecological examination    -  Primary   Relevant Orders   Cytology - PAP   Screening for depression       Screening for alcoholism       LGSIL on Pap smear of cervix       Relevant Orders   Cytology - PAP      Screening: -- Blood pressure screen normal -- Weight screening: normal -- Depression screening negative (PHQ-9) -- Nutrition: normal -- cholesterol screening: not due for screening -- osteoporosis screening: not due -- tobacco screening: not using -- alcohol screening: AUDIT questionnaire indicates low-risk usage. -- family history of breast cancer screening: done. not at high risk. -- no evidence of domestic violence or intimate partner violence. -- STD screening: gonorrhea/chlamydia NAAT not collected per patient request. -- pap smear collected per ASCCP guidelines -- flu vaccine declines -- HPV vaccination series: received -- COVID19 - did not receive  Thomasene Mohair, MD 10/08/2020 8:25 AM

## 2020-10-09 ENCOUNTER — Ambulatory Visit (INDEPENDENT_AMBULATORY_CARE_PROVIDER_SITE_OTHER): Payer: BC Managed Care – PPO | Admitting: Family Medicine

## 2020-10-09 ENCOUNTER — Encounter: Payer: Self-pay | Admitting: Family Medicine

## 2020-10-09 VITALS — BP 110/76 | HR 82 | Temp 98.0°F | Resp 14 | Ht 61.0 in | Wt 136.5 lb

## 2020-10-09 DIAGNOSIS — R41840 Attention and concentration deficit: Secondary | ICD-10-CM

## 2020-10-09 DIAGNOSIS — R609 Edema, unspecified: Secondary | ICD-10-CM

## 2020-10-09 DIAGNOSIS — F419 Anxiety disorder, unspecified: Secondary | ICD-10-CM

## 2020-10-09 DIAGNOSIS — R5383 Other fatigue: Secondary | ICD-10-CM

## 2020-10-09 DIAGNOSIS — G4700 Insomnia, unspecified: Secondary | ICD-10-CM

## 2020-10-09 DIAGNOSIS — D649 Anemia, unspecified: Secondary | ICD-10-CM | POA: Diagnosis not present

## 2020-10-09 DIAGNOSIS — R87612 Low grade squamous intraepithelial lesion on cytologic smear of cervix (LGSIL): Secondary | ICD-10-CM | POA: Diagnosis not present

## 2020-10-09 LAB — CBC WITH DIFFERENTIAL/PLATELET
MCH: 28 pg (ref 27.0–33.0)
Neutrophils Relative %: 63.8 %
Platelets: 424 10*3/uL — ABNORMAL HIGH (ref 140–400)

## 2020-10-09 NOTE — Progress Notes (Signed)
Patient ID: Sabrina Dyer, female    DOB: 09-14-1990, 30 y.o.   MRN: [id number]  PCP: Danelle Berryapia, Ulus Hazen, PA-C  Chief Complaint  Patient presents with  . Referral    Hard to focus/evual for ADHD    Subjective:   Sabrina Creaseonya Berrie is a 30 y.o. female, presents to clinic with CC of the following:  HPI  Having trouble with work and concentration at work, much worse in the past week Forgetful, not completing work, even with writing things down increased tasks and a lot of new people and processes She is avoiding tasks She says mood is good but she has bursts of energy and will dance around the house and clean a lot Mom notes hyperactivity and has encouraged her to get evaluated cause she's worried she is going to crash  No past eval for ADHD No past psych eval   Brother has ADHD Mom has panic attacks Little sister has OCD No other family psych hx of hx of ETOH abuse, denies any past sig trauma or abuse    Adult ADHD Self-Report Scale (ASRS-v1.1) Symptom Checklist  Part A 1. How often do you have trouble wrapping up the final details of a project, once the challenging parts have been done?: (!) Very Often 2. How often do you have difficulty getting things done in order when you have to do a task that requires organization?: (!) Very Often 3. How often do you have problems remembering appointments or obligations?: (!) Very Often 4. When you have a task that requires a lot of thought, how often do you avoid or delay getting started?: (!) Very Often 5. How often do you fidget or squirm with your hands or feet when you have to sit down for a long time?: (!) Often 6. How often do you feel overly active and compelled to do things, like you were driven by a motor?: (!) Very Often  Part B 7. How often do you make careless mistakes when you have to work on a boring or difficult project?: (!) Often 8. How often do you have difficulty keeping your attention when you are doing boring or repetitive  work?: (!) Very Often 9. How often do you have difficulty concentrating on what people say to you, even when they are speaking to you directly?: (!) Very Often 10. How often do you misplace or have difficulty finding things at home or at work?: (!) Very Often 11. How often are you distracted by activity or noise around you?: (!) Very Often 12. How often do you leave your seat in meetings or other situations in which you are expected to remain seated?: Never 13. How often do you feel restless or fidgety?: (!) Often 14. How often do you have difficulty unwinding and relaxing when you have time to yourself?: (!) Very Often 15. How often do you find yourself talking too much when you are in social situations?: (!) Often 16. When you are in a conversation, how often do you find yourself finishing the sentences of the people you are talking to, before they can finish them themselves?: (!) Often 17. How often do you have difficulty waiting your turn in situations when turn taking is required?: Never 18. How often do you interrupt others when they are busy?: Rarely     GAD 7 : Generalized Anxiety Score 10/09/2020 10/04/2019 07/02/2019  Nervous, Anxious, on Edge 1 1 2   Control/stop worrying 1 1 3   Worry too much - different things  2 1 3   Trouble relaxing 3 3 3   Restless 0 1 1  Easily annoyed or irritable 0 1 1  Afraid - awful might happen 0 0 0  Total GAD 7 Score 7 8 13   Anxiety Difficulty Not difficult at all Very difficult Very difficult   Depression screen [facility] 2/9 10/09/2020 06/09/2020 03/05/2020  Decreased Interest 2 1 0  Down, Depressed, Hopeless 1 0 0  PHQ - 2 Score 3 1 0  Altered sleeping 2 - 0  Tired, decreased energy 3 - 1  Change in appetite 0 - 1  Feeling bad or failure about yourself  0 - 0  Trouble concentrating 3 - 0  Moving slowly or fidgety/restless 0 - 0  Suicidal thoughts 0 - 0  PHQ-9 Score 11 - 2  Difficult doing work/chores Somewhat difficult - Not difficult at all    Some insomnia, its her alone time, never tried meds Has rx for hydroxyzine  MDQ done today and will be scanned into chart -during office visit in the exam room she denies any euphoria, excessive risk-taking behavior, denies any periods where she required very little sleep. Per the MDQ she answers positive for being more talkative and speaking faster than usually, having excessive thoughts racing through her head, being distracted having trouble concentrating and having more energy as usual but she denies any trouble with money, risky behavior, sexual promiscuity, irritability being overly confident no family hx of mood disorder or bipolar per pt today     There are no problems to display for this patient.     Current Outpatient Medications:  .  fluconazole (DIFLUCAN) 150 MG tablet, Take 1 tablet (150 mg total) by mouth every 3 (three) days as needed (for vaginal itching/yeast infection sx). (Patient not taking: Reported on 10/08/2020), Disp: 2 tablet, Rfl: 0 .  hydrOXYzine (ATARAX/VISTARIL) 25 MG tablet, 25 mg PO TID PRN for anxiety and/or 25-50 mg PO at bedtime PRN for insomnia, Disp: 60 tablet, Rfl: 1 .  levonorgestrel (MIRENA) 20 MCG/24HR IUD, 1 Intra Uterine Device (1 each total) by Intrauterine route once for 1 dose., Disp: 1 each, Rfl: 0 .  meclizine (ANTIVERT) 25 MG tablet, Take 0.5-1 tablets (12.5-25 mg total) by mouth 3 (three) times daily as needed (for dizziness/vertigo symptoms). (Patient not taking: Reported on 10/08/2020), Disp: 30 tablet, Rfl: 0 .  pantoprazole (PROTONIX) 40 MG tablet, Take 1 tablet (40 mg total) by mouth daily., Disp: 30 tablet, Rfl: 3 .  spironolactone (ALDACTONE) 25 MG tablet, Take 25 mg by mouth daily. (Patient not taking: No sig reported), Disp: , Rfl:  .  SUMAtriptan (IMITREX) 25 MG tablet, Take 25-50 mg PO at onset of headache and may repeat dose in 2 hours if still symptomatic.  Maximum daily dose 200 mg, Disp: 20 tablet, Rfl: 0   No Known  Allergies   Social History   Tobacco Use  . Smoking status: Never Smoker  . Smokeless tobacco: Never Used  Vaping Use  . Vaping Use: Never used  Substance Use Topics  . Alcohol use: No  . Drug use: No      Chart Review Today: I personally reviewed active problem list, medication list, allergies, family history, social history, health maintenance, notes from last encounter, lab results, imaging with the patient/caregiver today.   Review of Systems  Constitutional: Negative.   HENT: Negative.   Eyes: Negative.   Respiratory: Negative.   Cardiovascular: Negative.   Gastrointestinal: Negative.   Endocrine: Negative.  Genitourinary: Negative.   Musculoskeletal: Negative.   Skin: Negative.   Allergic/Immunologic: Negative.   Neurological: Negative.   Hematological: Negative.   Psychiatric/Behavioral: Positive for decreased concentration and sleep disturbance. Negative for agitation, behavioral problems, confusion, dysphoric mood, hallucinations, self-injury and suicidal ideas. The patient is hyperactive. The patient is not nervous/anxious.   All other systems reviewed and are negative.      Objective:   Vitals:   10/09/20 1458  BP: 110/76  Pulse: 82  Resp: 14  Temp: 98 F (36.7 C)  SpO2: 98%  Weight: 136 lb 8 oz (61.9 kg)  Height: 5\' 1"  (1.549 m)    Body mass index is 25.79 kg/m.  Physical Exam Vitals and nursing note reviewed.  Constitutional:      General: She is not in acute distress.    Appearance: Normal appearance. She is well-developed, well-groomed and normal weight. She is not ill-appearing, toxic-appearing or diaphoretic.     Interventions: Face mask in place.  HENT:     Head: Normocephalic and atraumatic.     Nose: Nose normal.  Eyes:     General:        Right eye: No discharge.        Left eye: No discharge.     Conjunctiva/sclera: Conjunctivae normal.  Neck:     Trachea: No tracheal deviation.  Cardiovascular:     Rate and Rhythm: Normal  rate and regular rhythm.  Pulmonary:     Effort: Pulmonary effort is normal. No respiratory distress.     Breath sounds: No stridor.  Musculoskeletal:        General: Normal range of motion.     Right hand: Normal. No swelling or deformity. Normal range of motion.     Left hand: Normal. No swelling or deformity. Normal range of motion.  Skin:    General: Skin is warm and dry.     Capillary Refill: Capillary refill takes less than 2 seconds.     Findings: No rash.  Neurological:     Mental Status: She is alert. Mental status is at baseline.     Motor: No abnormal muscle tone.     Coordination: Coordination normal.  Psychiatric:        Attention and Perception: Attention normal.        Mood and Affect: Mood and affect, mood and affect normal.        Speech: Speech normal.        Behavior: Behavior normal. Behavior is cooperative.        Thought Content: Thought content normal. Thought content does not include homicidal or suicidal ideation. Thought content does not include homicidal or suicidal plan.      Results for orders placed or performed in visit on 10/08/20  HM PAP SMEAR  Result Value Ref Range   HM Pap smear LSIL        Assessment & Plan:   1. Difficulty concentrating Pt is concerned that she may have ADHD, she would like formal psych assessment Her brother was recently diagnosed with ADHD Patient has never had an evaluation or treatment for ADHD as a child or ever in the past She recently feels overwhelmed by several changes at work and is having difficulty staying organized, completing tasks, remembering things She is slightly energetic, however her speech is fairly normal, not very rapid and not tangential she answers questions appropriately Patient will look up psychiatric clinics specialist in her insurance plan and will let me know who she  is like to go to - TSH  2. Swelling She mentions swelling of her hands that is occurred recently coming and going with few  pounds weight gain will check electrolytes and rule out hypothyroid - COMPLETE METABOLIC PANEL WITH GFR - TSH  3. Insomnia, unspecified type Patient has history of insomnia we previously discussed this, she has not tried medications that were prescribed for her, encouraged her to try a low-dose hydroxyzine over the weekend to see if she is able to get more sleep.  Explained that getting adequate rest will help her brain and cognition and if she is sleep deprived she will likely continue to suffer from difficulty concentrating  4. Anxiety History of anxiety not on medications she has never been formally or professionally assessed or been on any treatment for anxiety She believes her mother has panic attacks or panic disorder she has a sister with OCD but she denies any other family history other than a brother with new lead diagnosed ADHD  5. Other fatigue Likely from poor sleep and very busy schedule with 3 small children and a full-time job that is recently been overwhelming she does have a history of mild anemia will recheck labs make sure there is no deficiencies, hypothyroid or any worsening anemia - CBC with Differential/Platelet - COMPLETE METABOLIC PANEL WITH GFR - TSH - B12 and Folate Panel - Iron, TIBC and Ferritin Panel  6. Anemia, unspecified type See above - CBC with Differential/Platelet - TSH - B12 and Folate Panel - Iron, TIBC and Ferritin Panel      Danelle Berry, PA-C 10/09/20 3:29 PM

## 2020-10-09 NOTE — Patient Instructions (Signed)
Send me psychiatry clinics that are in network with your insurance and that look close to you and easy to get to - I will put in the referral and you should call the office yourself and try to get an appointment - easiest fastest way to get you your first appointment.

## 2020-10-10 LAB — CBC WITH DIFFERENTIAL/PLATELET
Absolute Monocytes: 554 cells/uL (ref 200–950)
Basophils Absolute: 84 cells/uL (ref 0–200)
Basophils Relative: 1 %
Eosinophils Absolute: 277 cells/uL (ref 15–500)
Eosinophils Relative: 3.3 %
HCT: 37.4 % (ref 35.0–45.0)
Hemoglobin: 12.3 g/dL (ref 11.7–15.5)
Lymphs Abs: 2125 cells/uL (ref 850–3900)
MCHC: 32.9 g/dL (ref 32.0–36.0)
MCV: 85 fL (ref 80.0–100.0)
MPV: 9.4 fL (ref 7.5–12.5)
Monocytes Relative: 6.6 %
Neutro Abs: 5359 cells/uL (ref 1500–7800)
RBC: 4.4 10*6/uL (ref 3.80–5.10)
RDW: 13 % (ref 11.0–15.0)
Total Lymphocyte: 25.3 %
WBC: 8.4 10*3/uL (ref 3.8–10.8)

## 2020-10-10 LAB — COMPLETE METABOLIC PANEL WITH GFR
AG Ratio: 1.6 (calc) (ref 1.0–2.5)
ALT: 38 U/L — ABNORMAL HIGH (ref 6–29)
AST: 32 U/L — ABNORMAL HIGH (ref 10–30)
Albumin: 4.6 g/dL (ref 3.6–5.1)
Alkaline phosphatase (APISO): 73 U/L (ref 31–125)
BUN: 21 mg/dL (ref 7–25)
CO2: 29 mmol/L (ref 20–32)
Calcium: 9.8 mg/dL (ref 8.6–10.2)
Chloride: 102 mmol/L (ref 98–110)
Creat: 0.79 mg/dL (ref 0.50–1.10)
GFR, Est African American: 117 mL/min/{1.73_m2} (ref >=60)
GFR, Est Non African American: 101 mL/min/{1.73_m2} (ref >=60)
Globulin: 2.8 g/dL (calc) (ref 1.9–3.7)
Glucose, Bld: 88 mg/dL (ref 65–99)
Potassium: 4.4 mmol/L (ref 3.5–5.3)
Sodium: 139 mmol/L (ref 135–146)
Total Bilirubin: 0.4 mg/dL (ref 0.2–1.2)
Total Protein: 7.4 g/dL (ref 6.1–8.1)

## 2020-10-10 LAB — IRON,TIBC AND FERRITIN PANEL
%SAT: 38 % (calc) (ref 16–45)
Ferritin: 33 ng/mL (ref 16–154)
Iron: 121 ug/dL (ref 40–190)
TIBC: 319 mcg/dL (calc) (ref 250–450)

## 2020-10-10 LAB — TSH: TSH: 1.36 mIU/L

## 2020-10-10 LAB — B12 AND FOLATE PANEL
Folate: 8.4 ng/mL
Vitamin B-12: 382 pg/mL (ref 200–1100)

## 2020-10-12 ENCOUNTER — Telehealth: Payer: Self-pay

## 2020-10-12 NOTE — Telephone Encounter (Signed)
Pt calling; can she see her test results in MyChart or does she have to wait for MD to call?  [phone]  Asked pt if she was talking about her pap smear results; states she is; adv pap results are not back yet; should be able to see them before MD calls her.

## 2020-10-13 ENCOUNTER — Encounter: Payer: Self-pay | Admitting: Family Medicine

## 2020-10-13 LAB — CYTOLOGY - PAP
Comment: NEGATIVE
High risk HPV: POSITIVE — AB

## 2020-10-20 ENCOUNTER — Telehealth: Payer: Self-pay | Admitting: Obstetrics and Gynecology

## 2020-10-20 NOTE — Telephone Encounter (Signed)
Discussed LGSIL pap smear with HPV+.  DIscussed need for colposcopy.  Patient had colpo last year. She voiced understanding and agreement. She will call to schedule.  She also voiced understanding of importance of this follow up.

## 2020-11-20 ENCOUNTER — Other Ambulatory Visit: Payer: Self-pay

## 2020-11-20 ENCOUNTER — Encounter: Payer: Self-pay | Admitting: Obstetrics and Gynecology

## 2020-11-20 ENCOUNTER — Ambulatory Visit (INDEPENDENT_AMBULATORY_CARE_PROVIDER_SITE_OTHER): Payer: BC Managed Care – PPO | Admitting: Obstetrics and Gynecology

## 2020-11-20 ENCOUNTER — Other Ambulatory Visit (HOSPITAL_COMMUNITY)
Admission: RE | Admit: 2020-11-20 | Discharge: 2020-11-20 | Disposition: A | Discharge status: Home / Self Care | Payer: BC Managed Care – PPO | Source: Ambulatory Visit | Attending: Obstetrics and Gynecology | Admitting: Obstetrics and Gynecology

## 2020-11-20 VITALS — BP 124/74 | Ht 60.0 in | Wt 137.0 lb

## 2020-11-20 DIAGNOSIS — R87612 Low grade squamous intraepithelial lesion on cytologic smear of cervix (LGSIL): Secondary | ICD-10-CM | POA: Insufficient documentation

## 2020-11-20 DIAGNOSIS — N870 Mild cervical dysplasia: Secondary | ICD-10-CM | POA: Diagnosis not present

## 2020-11-20 NOTE — Progress Notes (Signed)
HPI:  Sabrina Dyer is a 30 y.o.  [phone]  who presents today for evaluation and management of abnormal cervical cytology.    Dysplasia History:  07/23/2019  Results were: low-grade squamous intraepithelial neoplasia (LGSIL - encompassing HPV,mild dysplasia,CIN I) /neg HPV DNA not done 10/07/2019: Colpo - CIN1 09/2020: LGSIL/HPV+   OB History  Gravida Para Term Preterm AB Living  3 3 3  0 0 3  SAB IAB Ectopic Multiple Live Births  0 0 0 0 3    # Outcome Date GA Lbr Len/2nd Weight Sex Delivery Anes PTL Lv  3 Term 07/31/14 [redacted]w[redacted]d 05:30 / 00:08 7 lb 10.2 oz (3.465 kg) M Vag-Spont None  LIV     Birth Comments: facial bruising  2 Term           1 Term             Past Medical History:  Diagnosis Date  . Anemia   . Medical history non-contributory     Past Surgical History:  Procedure Laterality Date  . CHOLECYSTECTOMY      SOCIAL HISTORY:  Social History   Substance and Sexual Activity  Alcohol Use No   Substance and Sexual Activity  Alcohol Use No     Substance and Sexual Activity  Drug Use No     Family History  Problem Relation Age of Onset  . Hypertension Mother   . Diabetes Mother   . Heart attack Mother 87  . COPD Maternal Grandmother   . Emphysema Maternal Grandmother   . Diabetes Paternal Grandfather   . Breast cancer Paternal Aunt 53       has contact  . Breast cancer Paternal Aunt 8       has contact    ALLERGIES:  Patient has no known allergies.  Current Outpatient Medications on File Prior to Visit  Medication Sig Dispense Refill  . fluconazole (DIFLUCAN) 150 MG tablet Take 1 tablet (150 mg total) by mouth every 3 (three) days as needed (for vaginal itching/yeast infection sx). (Patient not taking: Reported on 10/08/2020) 2 tablet 0  . hydrOXYzine (ATARAX/VISTARIL) 25 MG tablet 25 mg PO TID PRN for anxiety and/or 25-50 mg PO at bedtime PRN for insomnia 60 tablet 1  . levonorgestrel (MIRENA) 20 MCG/24HR IUD 1 Intra Uterine Device (1 each  total) by Intrauterine route once for 1 dose. 1 each 0  . meclizine (ANTIVERT) 25 MG tablet Take 0.5-1 tablets (12.5-25 mg total) by mouth 3 (three) times daily as needed (for dizziness/vertigo symptoms). (Patient not taking: Reported on 10/08/2020) 30 tablet 0  . pantoprazole (PROTONIX) 40 MG tablet Take 1 tablet (40 mg total) by mouth daily. 30 tablet 3  . spironolactone (ALDACTONE) 25 MG tablet Take 25 mg by mouth daily. (Patient not taking: No sig reported)    . SUMAtriptan (IMITREX) 25 MG tablet Take 25-50 mg PO at onset of headache and may repeat dose in 2 hours if still symptomatic.  Maximum daily dose 200 mg 20 tablet 0   No current facility-administered medications on file prior to visit.    Physical Exam: -Vitals:  BP 124/74   Ht 5' (1.524 m)   Wt 137 lb (62.1 kg)   BMI 26.76 kg/m  GEN: WD, WN, NAD.  A+ O x 3, good mood and affect. ABD:  NT, ND.  Soft, no masses.  No hernias noted.   Pelvic:   Vulva: Normal appearance.  No lesions.  Vagina: No lesions or abnormalities noted.  Support: Normal pelvic support.  Urethra No masses tenderness or scarring.  Meatus Normal size without lesions or prolapse.  Cervix: See below.  Anus: Normal exam.  No lesions.  Perineum: Normal exam.  No lesions.        Bimanual   Uterus: Normal size.  Non-tender.  Mobile.  AV.  Adnexae: No masses.  Non-tender to palpation.  Cul-de-sac: Negative for abnormality.   PROCEDURE: 1.  Urine Pregnancy Test:  not done due to having an IUD 2.  Colposcopy performed with 4% acetic acid after verbal consent obtained                                        -Aceto-white Lesions Location(s): six oclock and 10-12 o'clock.              -Biopsy performed at 6 and 11 o'clock               -ECC indicated and performed: Yes.       -Biopsy sites made hemostatic with pressure, AgNO3, and/or Monsel's solution   -Satisfactory colposcopy: No.    -Evidence of Invasive cervical CA :  NO  ASSESSMENT:  Sabrina Dyer is a 30  y.o. G3P3003 here for  1. LGSIL on Pap smear of cervix   .  PLAN: I discussed the grading system of pap smears and HPV high risk viral types.  We will discuss and base management after colpo results return.     Thomasene Mohair, MD  [facility], [facility] 11/20/2020  9:51 AM

## 2020-11-24 DIAGNOSIS — R41840 Attention and concentration deficit: Secondary | ICD-10-CM | POA: Diagnosis not present

## 2020-11-24 DIAGNOSIS — F411 Generalized anxiety disorder: Secondary | ICD-10-CM | POA: Diagnosis not present

## 2020-11-24 DIAGNOSIS — F410 Panic disorder [episodic paroxysmal anxiety] without agoraphobia: Secondary | ICD-10-CM | POA: Diagnosis not present

## 2020-11-24 DIAGNOSIS — F909 Attention-deficit hyperactivity disorder, unspecified type: Secondary | ICD-10-CM | POA: Diagnosis not present

## 2020-11-25 LAB — SURGICAL PATHOLOGY

## 2020-12-10 DIAGNOSIS — F411 Generalized anxiety disorder: Secondary | ICD-10-CM | POA: Diagnosis not present

## 2020-12-10 DIAGNOSIS — F410 Panic disorder [episodic paroxysmal anxiety] without agoraphobia: Secondary | ICD-10-CM | POA: Diagnosis not present

## 2020-12-10 DIAGNOSIS — F4323 Adjustment disorder with mixed anxiety and depressed mood: Secondary | ICD-10-CM | POA: Diagnosis not present

## 2020-12-10 DIAGNOSIS — F3289 Other specified depressive episodes: Secondary | ICD-10-CM | POA: Diagnosis not present

## 2020-12-16 DIAGNOSIS — F909 Attention-deficit hyperactivity disorder, unspecified type: Secondary | ICD-10-CM | POA: Diagnosis not present

## 2020-12-21 ENCOUNTER — Ambulatory Visit: Payer: BC Managed Care – PPO | Admitting: Family Medicine

## 2020-12-23 DIAGNOSIS — F410 Panic disorder [episodic paroxysmal anxiety] without agoraphobia: Secondary | ICD-10-CM | POA: Diagnosis not present

## 2020-12-23 DIAGNOSIS — F411 Generalized anxiety disorder: Secondary | ICD-10-CM | POA: Diagnosis not present

## 2020-12-23 DIAGNOSIS — F3289 Other specified depressive episodes: Secondary | ICD-10-CM | POA: Diagnosis not present

## 2020-12-23 DIAGNOSIS — F4323 Adjustment disorder with mixed anxiety and depressed mood: Secondary | ICD-10-CM | POA: Diagnosis not present

## 2020-12-29 ENCOUNTER — Other Ambulatory Visit: Payer: Self-pay

## 2020-12-29 ENCOUNTER — Encounter: Payer: Self-pay | Admitting: Unknown Physician Specialty

## 2020-12-29 ENCOUNTER — Ambulatory Visit (INDEPENDENT_AMBULATORY_CARE_PROVIDER_SITE_OTHER): Payer: BC Managed Care – PPO | Admitting: Unknown Physician Specialty

## 2020-12-29 VITALS — BP 118/68 | Temp 98.3°F | Resp 16 | Ht 60.0 in | Wt 137.3 lb

## 2020-12-29 DIAGNOSIS — S8992XA Unspecified injury of left lower leg, initial encounter: Secondary | ICD-10-CM

## 2020-12-29 NOTE — Progress Notes (Signed)
BP 118/68   Temp 98.3 F (36.8 C) (Oral)   Resp 16   Ht 5' (1.524 m)   Wt 137 lb 4.8 oz (62.3 kg)   BMI 26.81 kg/m    Subjective:    Patient ID: Sabrina Dyer, female    DOB: 23-Mar-1991, 30 y.o.   MRN: [id number]  HPI: Sabrina Dyer is a 30 y.o. female  Chief Complaint  Patient presents with  . Knee Pain    Right knee discomfort for 2 months   Knee Pain  Incident onset: 2 months ago. Incident location: Twisted knee while on the floor playing with child. The injury mechanism was a twisting injury. The pain is present in the left leg. The quality of the pain is described as aching. The pain is mild (Hurts after walking a lot). The pain has been improving since onset. Pertinent negatives include no inability to bear weight, loss of motion, loss of sensation, muscle weakness, numbness or tingling. She reports no foreign bodies present. Exacerbated by: walking. She has tried nothing for the symptoms.     Relevant past medical, surgical, family and social history reviewed and updated as indicated. Interim medical history since our last visit reviewed. Allergies and medications reviewed and updated.  Review of Systems  Neurological: Negative for tingling and numbness.    Per HPI unless specifically indicated above     Objective:    BP 118/68   Temp 98.3 F (36.8 C) (Oral)   Resp 16   Ht 5' (1.524 m)   Wt 137 lb 4.8 oz (62.3 kg)   BMI 26.81 kg/m   Wt Readings from Last 3 Encounters:  12/29/20 137 lb 4.8 oz (62.3 kg)  11/20/20 137 lb (62.1 kg)  10/09/20 136 lb 8 oz (61.9 kg)    Physical Exam Constitutional:      General: She is not in acute distress.    Appearance: Normal appearance. She is well-developed.  HENT:     Head: Normocephalic and atraumatic.  Eyes:     General: Lids are normal. No scleral icterus.       Right eye: No discharge.        Left eye: No discharge.     Conjunctiva/sclera: Conjunctivae normal.  Cardiovascular:     Rate and Rhythm: Normal rate.   Pulmonary:     Effort: Pulmonary effort is normal.  Abdominal:     Palpations: There is no hepatomegaly or splenomegaly.  Musculoskeletal:        General: Normal range of motion.     Right knee: Normal.     Left knee: Normal.  Skin:    Coloration: Skin is not pale.     Findings: No rash.  Neurological:     Mental Status: She is alert and oriented to person, place, and time.  Psychiatric:        Behavior: Behavior normal.        Thought Content: Thought content normal.        Judgment: Judgment normal.     Results for orders placed or performed in visit on 11/20/20  Surgical pathology  Result Value Ref Range   SURGICAL PATHOLOGY      SURGICAL PATHOLOGY CASE: [id number] PATIENT: Sabrina Dyer Surgical Pathology Report     Clinical History: LGSIL (cm)   FINAL MICROSCOPIC DIAGNOSIS:  A. CERVIX, 6 O'CLOCK, BIOPSY: -  Low grade squamous intraepithelial lesion (CIN1, mild dysplasia)  B. CERVIX, 11 O'CLOCK, BIOPSY: -  Low grade squamous intraepithelial lesion (  CIN1, mild dysplasia)  C. 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 The nature of the curettage sample precludes accurate grading of the dysplastic epithelium; however, based on cytology and p16 immunohistochemistry a higher-grade lesion cannot be exclude. Clinical follow-up is recommended.  GROSS DESCRIPTION:  A: Received in formalin is a tan, soft tissue fragment that is submitted in toto.  Size: 0.5 cm, 1 block submitted.  B: Received in formalin is a tan, soft tissue fragment t hat is submitted in toto.  Size: 0.6 cm, 1 block submitted.  C: Received in formalin is blood tinged mucus that is entirely submitted in one block.  Volume: 0.8 x 0.8 x 0.3 cm (GRP 11/23/2020)   Final Diagnosis performed by Manning Charity, MD.   Electronically  signed 11/25/2020 Technical and / or Professional components performed at [facility]. [facility], [address] [place], [place] [address].  Immunohistochemistry Technical component (if applicable) was performed at [facility] [address], [place], [place] [address].   IMMUNOHISTOCHEMISTRY DISCLAIMER (if applicable): Some of these immunohistochemical stains may have been developed and the performance characteristics determine by [facility]. Some may not have been cleared or approved by the U.S. Food and Drug Administration. The FDA has determined that such clearance or approval is not necessary. This test is used for clinical purposes. It should not be regarded  as investigational or for research. This laboratory is certified under the Clinical Laboratory Improvement Amendments of 1988 (CLIA-88) as qualified to perform high complexity clinical laboratory testing.  The controls stained appropriately.       Assessment & Plan:   Problem List Items Addressed This Visit   None   Visit Diagnoses    Injury of left knee, initial encounter    -  Primary   Pt with twisting injury improving.  Exam normal.  I suspect this will continue to improve over the next 6 weeks.  RTC prn.         Follow up plan: Return if symptoms worsen or fail to improve.

## 2021-01-06 DIAGNOSIS — F909 Attention-deficit hyperactivity disorder, unspecified type: Secondary | ICD-10-CM | POA: Diagnosis not present

## 2021-01-06 DIAGNOSIS — F410 Panic disorder [episodic paroxysmal anxiety] without agoraphobia: Secondary | ICD-10-CM | POA: Diagnosis not present

## 2021-01-06 DIAGNOSIS — F4323 Adjustment disorder with mixed anxiety and depressed mood: Secondary | ICD-10-CM | POA: Diagnosis not present

## 2021-01-06 DIAGNOSIS — F411 Generalized anxiety disorder: Secondary | ICD-10-CM | POA: Diagnosis not present

## 2021-01-08 DIAGNOSIS — F4323 Adjustment disorder with mixed anxiety and depressed mood: Secondary | ICD-10-CM | POA: Diagnosis not present

## 2021-01-08 DIAGNOSIS — F411 Generalized anxiety disorder: Secondary | ICD-10-CM | POA: Diagnosis not present

## 2021-01-08 DIAGNOSIS — F410 Panic disorder [episodic paroxysmal anxiety] without agoraphobia: Secondary | ICD-10-CM | POA: Diagnosis not present

## 2021-01-08 DIAGNOSIS — F3289 Other specified depressive episodes: Secondary | ICD-10-CM | POA: Diagnosis not present

## 2021-02-01 DIAGNOSIS — F411 Generalized anxiety disorder: Secondary | ICD-10-CM | POA: Diagnosis not present

## 2021-02-01 DIAGNOSIS — F4323 Adjustment disorder with mixed anxiety and depressed mood: Secondary | ICD-10-CM | POA: Diagnosis not present

## 2021-02-01 DIAGNOSIS — F3289 Other specified depressive episodes: Secondary | ICD-10-CM | POA: Diagnosis not present

## 2021-02-01 DIAGNOSIS — F410 Panic disorder [episodic paroxysmal anxiety] without agoraphobia: Secondary | ICD-10-CM | POA: Diagnosis not present

## 2021-02-02 DIAGNOSIS — F410 Panic disorder [episodic paroxysmal anxiety] without agoraphobia: Secondary | ICD-10-CM | POA: Diagnosis not present

## 2021-02-02 DIAGNOSIS — F4323 Adjustment disorder with mixed anxiety and depressed mood: Secondary | ICD-10-CM | POA: Diagnosis not present

## 2021-02-02 DIAGNOSIS — F909 Attention-deficit hyperactivity disorder, unspecified type: Secondary | ICD-10-CM | POA: Diagnosis not present

## 2021-02-02 DIAGNOSIS — F411 Generalized anxiety disorder: Secondary | ICD-10-CM | POA: Diagnosis not present

## 2021-04-01 DIAGNOSIS — J029 Acute pharyngitis, unspecified: Secondary | ICD-10-CM | POA: Diagnosis not present

## 2021-04-09 ENCOUNTER — Ambulatory Visit: Payer: Self-pay

## 2021-04-09 NOTE — Telephone Encounter (Signed)
Pt. Reports she has been on Amoxicillin for throat infection, Thinks she has developed a vaginal yeast infection. Requests appointment. No availability today. [facility] go to [facility].

## 2021-04-09 NOTE — Telephone Encounter (Signed)
Reason for Disposition  MODERATE-SEVERE itching (i.e., interferes with school, work, or sleep)  Answer Assessment - Initial Assessment Questions 1. SYMPTOM: "What's the main symptom you're concerned about?" (e.g., pain, itching, dryness)     Itching 2. LOCATION: "Where is the  itching located?" (e.g., inside/outside, left/right)     Outside 3. ONSET: "When did the  symptom start?"     3 days ago 4. PAIN: "Is there any pain?" If Yes, ask: "How bad is it?" (Scale: 1-10; mild, moderate, severe)     No 5. ITCHING: "Is there any itching?" If Yes, ask: "How bad is it?" (Scale: 1-10; mild, moderate, severe)     Moderate 6. CAUSE: "What do you think is causing the discharge?" "Have you had the same problem before? What happened then?"     Antibiotic 7. OTHER SYMPTOMS: "Do you have any other symptoms?" (e.g., fever, itching, vaginal bleeding, pain with urination, injury to genital area, vaginal foreign body)     White discharge, burning 8. PREGNANCY: "Is there any chance you are pregnant?" "When was your last menstrual period?"     No  Protocols used: Vaginal Symptoms-A-AH

## 2021-05-04 ENCOUNTER — Ambulatory Visit: Payer: Self-pay | Admitting: *Deleted

## 2021-05-04 NOTE — Telephone Encounter (Signed)
Reason for Disposition  [1] MILD swelling (puffiness) of both hands AND [2] new-onset or worsening  (Exception: caused by hot weather or normal pregnancy swelling)  Answer Assessment - Initial Assessment Questions 1. ONSET: "When did the swelling start?" (e.g., minutes, hours, days)     5-6 months ago  2. LOCATION: "What part of the hand is swollen?"  "Are both hands swollen or just one hand?"     Whole hand and fingers and left more than right . 3. SEVERITY: "How bad is the swelling?" (e.g., localized; mild, moderate, severe)   - BALL OR LUMP: small ball or lump   - LOCALIZED: puffy or swollen area or patch of skin   - JOINT SWELLING: swelling of a joint   - MILD: puffiness or mild swelling of fingers or hand   - MODERATE: fingers and hand are swollen   - SEVERE: swelling of entire hand and up into forearm     Moderate  4. REDNESS: "Does the swelling look red or infected?"     no 5. PAIN: "Is the swelling painful to touch?" If Yes, ask: "How painful is it?"   (Scale 1-10; mild, moderate or severe)     no 6. FEVER: "Do you have a fever?" If Yes, ask: "What is it, how was it measured, and when did it start?"      No  7. CAUSE: "What do you think is causing the hand swelling?" (e.g., heat, insect bite, pregnancy, recent injury)     Overheated mostly outside  8. MEDICAL HISTORY: "Do you have a history of heart failure, kidney disease, liver failure, or cancer?"     Hx Liver enlarged  9. RECURRENT SYMPTOM: "Have you had hand swelling before?" If Yes, ask: "When was the last time?" "What happened that time?"     Yes comes and goes  10. OTHER SYMPTOMS: "Do you have any other symptoms?" (e.g., blurred vision, difficulty breathing, headache)       no 11. PREGNANCY: "Is there any chance you are pregnant?" "When was your last menstrual period?"       no  Protocols used: Hand Swelling-A-AH

## 2021-05-04 NOTE — Telephone Encounter (Signed)
The patient would like to be contacted about swelling in their hands   The patient shares that both hands well but they've noticed it particularly in their left hand   The patient shares that the swelling has occurred for roughly 5 months   Patient shares that the swelling decreases in the evenings but does increase around lunch   Please contact further when possible       Called patient to review symptoms of hand swelling. C/o hand swelling x 5-6 months . Comes and goes especially when walking. Left hand swelling more than right hand. Swelling noted in hands , fingers. Does not extend to wrist or forearm. Reports SOB with exertion. Denies chest pain , difficulty breathing, fever. Has not started any new medications. Appt scheduled for 05/05/21. Care advise given. Patient verbalized understanding of care advise and to call back or go to ED if symptoms worsen.

## 2021-05-05 ENCOUNTER — Other Ambulatory Visit: Payer: Self-pay

## 2021-05-05 ENCOUNTER — Ambulatory Visit (INDEPENDENT_AMBULATORY_CARE_PROVIDER_SITE_OTHER): Payer: BC Managed Care – PPO | Admitting: Family Medicine

## 2021-05-05 ENCOUNTER — Encounter: Payer: Self-pay | Admitting: Family Medicine

## 2021-05-05 VITALS — BP 114/78 | HR 81 | Temp 98.3°F | Resp 16 | Ht 60.0 in | Wt 139.9 lb

## 2021-05-05 DIAGNOSIS — R5383 Other fatigue: Secondary | ICD-10-CM

## 2021-05-05 DIAGNOSIS — M7989 Other specified soft tissue disorders: Secondary | ICD-10-CM | POA: Diagnosis not present

## 2021-05-05 DIAGNOSIS — R1013 Epigastric pain: Secondary | ICD-10-CM | POA: Diagnosis not present

## 2021-05-05 DIAGNOSIS — D649 Anemia, unspecified: Secondary | ICD-10-CM | POA: Diagnosis not present

## 2021-05-05 DIAGNOSIS — R748 Abnormal levels of other serum enzymes: Secondary | ICD-10-CM

## 2021-05-05 LAB — CBC WITH DIFFERENTIAL/PLATELET
Absolute Monocytes: 607 cells/uL (ref 200–950)
Basophils Absolute: 62 cells/uL (ref 0–200)
Basophils Relative: 0.7 %
Eosinophils Absolute: 229 cells/uL (ref 15–500)
Eosinophils Relative: 2.6 %
HCT: 39.8 % (ref 35.0–45.0)
Hemoglobin: 12.8 g/dL (ref 11.7–15.5)
Lymphs Abs: 2270 cells/uL (ref 850–3900)
MCH: 27.8 pg (ref 27.0–33.0)
MCHC: 32.2 g/dL (ref 32.0–36.0)
MCV: 86.3 fL (ref 80.0–100.0)
MPV: 9.3 fL (ref 7.5–12.5)
Monocytes Relative: 6.9 %
Neutro Abs: 5632 cells/uL (ref 1500–7800)
Neutrophils Relative %: 64 %
Platelets: 423 10*3/uL — ABNORMAL HIGH (ref 140–400)
RBC: 4.61 10*6/uL (ref 3.80–5.10)
RDW: 13 % (ref 11.0–15.0)
Total Lymphocyte: 25.8 %
WBC: 8.8 10*3/uL (ref 3.8–10.8)

## 2021-05-05 LAB — COMPLETE METABOLIC PANEL WITH GFR
AG Ratio: 1.5 (calc) (ref 1.0–2.5)
ALT: 35 U/L — ABNORMAL HIGH (ref 6–29)
AST: 26 U/L (ref 10–30)
Albumin: 4.4 g/dL (ref 3.6–5.1)
Alkaline phosphatase (APISO): 82 U/L (ref 31–125)
BUN: 14 mg/dL (ref 7–25)
CO2: 29 mmol/L (ref 20–32)
Calcium: 9.8 mg/dL (ref 8.6–10.2)
Chloride: 102 mmol/L (ref 98–110)
Creat: 0.7 mg/dL (ref 0.50–0.97)
Globulin: 2.9 g/dL (calc) (ref 1.9–3.7)
Glucose, Bld: 87 mg/dL (ref 65–99)
Potassium: 4.2 mmol/L (ref 3.5–5.3)
Sodium: 137 mmol/L (ref 135–146)
Total Bilirubin: 0.4 mg/dL (ref 0.2–1.2)
Total Protein: 7.3 g/dL (ref 6.1–8.1)
eGFR: 119 mL/min/{1.73_m2} (ref >=60)

## 2021-05-05 LAB — TSH: TSH: 1.8 mIU/L

## 2021-05-05 MED ORDER — PANTOPRAZOLE SODIUM 40 MG PO TBEC: 40.0000 mg | DELAYED_RELEASE_TABLET | Freq: Every day | ORAL | 3 refills | Status: DC

## 2021-05-05 NOTE — Patient Instructions (Signed)
Work on low salt diet and see if that is at all related to your hand swelling  Please let me know if you have new joint pain or tingling weakness

## 2021-05-05 NOTE — Progress Notes (Signed)
Patient ID: Sabrina Dyer, female    DOB: 14-Oct-1990, 30 y.o.   MRN: [id number]  PCP: Sabrina Berry, PA-C  Chief Complaint  Patient presents with  . Edema    Mainly Left Hand x5-6 months but also happens on right hand rarely. Left hand tends to get worst.    Subjective:   Sabrina Dyer is a 30 y.o. female, presents to clinic with CC of the following:  HPI  Patient presents with concerns of 5 to 6 months of intermittent hand and finger swelling.  It tends to occur throughout the day and is worse at the end of the day and is back to normal in the mornings.  She has no pain, redness, weakness numbness or tingling.  She has just noticed that her rings are tighter at the end of the day and in the mornings she can take the morning she is able to put them on or remove them easily She has noticed that its gotten worse with hot weather, she tries to avoid cooking with salt and has not noticed if any saltier foods make it worse.  She denies any myalgias, arthralgias.  No swelling to her lower extremities She was on spironolactone for acne but stopped that sometime she states last year      There are no problems to display for this patient.     Current Outpatient Medications:  .  hydrOXYzine (ATARAX/VISTARIL) 25 MG tablet, 25 mg PO TID PRN for anxiety and/or 25-50 mg PO at bedtime PRN for insomnia, Disp: 60 tablet, Rfl: 1 .  pantoprazole (PROTONIX) 40 MG tablet, Take 1 tablet (40 mg total) by mouth daily., Disp: 30 tablet, Rfl: 3 .  sertraline (ZOLOFT) 100 MG tablet, Take by mouth., Disp: , Rfl:  .  fluconazole (DIFLUCAN) 150 MG tablet, Take 1 tablet (150 mg total) by mouth every 3 (three) days as needed (for vaginal itching/yeast infection sx). (Patient not taking: No sig reported), Disp: 2 tablet, Rfl: 0 .  levonorgestrel (MIRENA) 20 MCG/24HR IUD, 1 Intra Uterine Device (1 each total) by Intrauterine route once for 1 dose., Disp: 1 each, Rfl: 0 .  meclizine (ANTIVERT) 25 MG tablet, Take  0.5-1 tablets (12.5-25 mg total) by mouth 3 (three) times daily as needed (for dizziness/vertigo symptoms). (Patient not taking: No sig reported), Disp: 30 tablet, Rfl: 0 .  sertraline (ZOLOFT) 50 MG tablet, PLEASE SEE ATTACHED FOR DETAILED DIRECTIONS (Patient not taking: Reported on 05/05/2021), Disp: , Rfl:  .  spironolactone (ALDACTONE) 25 MG tablet, Take 25 mg by mouth daily. (Patient not taking: No sig reported), Disp: , Rfl:  .  SUMAtriptan (IMITREX) 25 MG tablet, Take 25-50 mg PO at onset of headache and may repeat dose in 2 hours if still symptomatic.  Maximum daily dose 200 mg (Patient not taking: No sig reported), Disp: 20 tablet, Rfl: 0   No Known Allergies   Social History   Tobacco Use  . Smoking status: Never  . Smokeless tobacco: Never  Vaping Use  . Vaping Use: Never used  Substance Use Topics  . Alcohol use: No  . Drug use: No      Chart Review Today: I personally reviewed active problem list, medication list, allergies, family history, social history, health maintenance, notes from last encounter, lab results, imaging with the patient/caregiver today.   Review of Systems  Constitutional: Negative.  Negative for activity change, appetite change, chills, diaphoresis, fatigue, fever and unexpected weight change.  HENT: Negative.  Eyes: Negative.   Respiratory: Negative.    Cardiovascular: Negative.  Negative for chest pain, palpitations and leg swelling.  Gastrointestinal: Negative.   Endocrine: Negative.   Genitourinary: Negative.  Negative for difficulty urinating, frequency and urgency.  Musculoskeletal: Negative.  Negative for arthralgias, joint swelling and myalgias.  Skin: Negative.  Negative for color change.  Allergic/Immunologic: Negative.   Neurological: Negative.   Hematological: Negative.   Psychiatric/Behavioral: Negative.    All other systems reviewed and are negative.     Objective:   Vitals:   05/05/21 1502  BP: 114/78  Pulse: 81   Resp: 16  Temp: 98.3 F (36.8 C)  TempSrc: Oral  SpO2: 97%  Weight: 139 lb 14.4 oz (63.5 kg)  Height: 5' (1.524 m)    Body mass index is 27.32 kg/m.  Physical Exam Vitals and nursing note reviewed.  Constitutional:      General: She is not in acute distress.    Appearance: Normal appearance. She is well-developed. She is not ill-appearing, toxic-appearing or diaphoretic.  HENT:     Head: Normocephalic and atraumatic.     Right Ear: External ear normal.     Left Ear: External ear normal.     Nose: Nose normal.  Eyes:     General:        Right eye: No discharge.        Left eye: No discharge.     Conjunctiva/sclera: Conjunctivae normal.  Neck:     Trachea: No tracheal deviation.  Cardiovascular:     Rate and Rhythm: Normal rate and regular rhythm.     Pulses: Normal pulses.  Pulmonary:     Effort: Pulmonary effort is normal. No respiratory distress.     Breath sounds: No stridor.  Abdominal:     General: Bowel sounds are normal.  Musculoskeletal:     Right wrist: Normal. No swelling, deformity, lacerations, tenderness, bony tenderness, snuff box tenderness or crepitus. Normal range of motion. Normal pulse.     Left wrist: Normal. No swelling, deformity, lacerations, tenderness, bony tenderness, snuff box tenderness or crepitus. Normal range of motion. Normal pulse.     Right hand: No swelling, deformity, tenderness or bony tenderness. Normal range of motion. Normal strength. Normal sensation. Normal capillary refill. Normal pulse.     Left hand: No swelling, deformity, tenderness or bony tenderness. Normal range of motion. Normal strength. Normal sensation. Normal capillary refill. Normal pulse.     Right lower leg: No edema.     Left lower leg: No edema.  Skin:    General: Skin is warm and dry.     Findings: No rash.  Neurological:     Mental Status: She is alert.     Motor: No abnormal muscle tone.     Coordination: Coordination normal.  Psychiatric:        Mood  and Affect: Mood normal.        Behavior: Behavior normal.     Results for orders placed or performed in visit on 11/20/20  Surgical pathology  Result Value Ref Range   SURGICAL PATHOLOGY      SURGICAL PATHOLOGY CASE: [id number] PATIENT: Sabrina Dyer Surgical Pathology Report     Clinical History: LGSIL (cm)   FINAL MICROSCOPIC DIAGNOSIS:  A. CERVIX, 6 O'CLOCK, BIOPSY: -  Low grade squamous intraepithelial lesion (CIN1, mild dysplasia)  B. CERVIX, 11 O'CLOCK, BIOPSY: -  Low grade squamous intraepithelial lesion (CIN1, mild dysplasia)  C. 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 The nature of the curettage sample precludes accurate grading of the dysplastic epithelium; however, based on cytology and p16 immunohistochemistry a higher-grade lesion cannot be exclude. Clinical follow-up is recommended.  GROSS DESCRIPTION:  A: Received in formalin is a tan, soft tissue fragment that is submitted in toto.  Size: 0.5 cm, 1 block submitted.  B: Received in formalin is a tan, soft tissue fragment t hat is submitted in toto.  Size: 0.6 cm, 1 block submitted.  C: Received in formalin is blood tinged mucus that is entirely submitted in one block.  Volume: 0.8 x 0.8 x 0.3 cm (GRP 11/23/2020)   Final Diagnosis performed by Manning Charity, MD.   Electronically signed 11/25/2020 Technical and / or Professional components performed at [facility]. [facility], [address] [place], [place] [address].  Immunohistochemistry Technical component (if applicable) was performed at [facility]. [address], [place], [place] [address].   IMMUNOHISTOCHEMISTRY DISCLAIMER (if applicable): Some of these immunohistochemical stains may have been developed and the performance characteristics determine by  [facility]. Some may not have been cleared or approved by the U.S. Food and Drug Administration. The FDA has determined that such clearance or approval is not necessary. This test is used for clinical purposes. It should not be regarded  as investigational or for research. This laboratory is certified under the Clinical Laboratory Improvement Amendments of 1988 (CLIA-88) as qualified to perform high complexity clinical laboratory testing.  The controls stained appropriately.        Assessment & Plan:     ICD-10-CM   1. Swelling of both hands  M79.89 COMPLETE METABOLIC PANEL WITH GFR    CBC with Differential/Platelet    TSH   suspect it is mild dependent edema, avoid salt, screen labs, reassured pt, no pain, swelling, weakness, tingling, doubt carpal tunnel or autoimmune pathology     2. Epigastric pain  R10.13 pantoprazole (PROTONIX) 40 MG tablet   Refill on Protonix encouraged her to avoid food triggers and use Pepcid over-the-counter as needed    3. Anemia, unspecified type  D64.9 COMPLETE METABOLIC PANEL WITH GFR    CBC with Differential/Platelet    TSH   Follow-up and recheck CBC    4. Other fatigue  R53.83 COMPLETE METABOLIC PANEL WITH GFR    CBC with Differential/Platelet    TSH    5. Elevated liver enzymes  R74.8 COMPLETE METABOLIC PANEL WITH GFR   Recheck liver enzymes which were previously mildly elevated          Sabrina Berry, PA-C 05/05/21 3:40 PM

## 2021-06-11 ENCOUNTER — Ambulatory Visit: Payer: Self-pay | Admitting: *Deleted

## 2021-06-11 NOTE — Telephone Encounter (Signed)
Reason for Disposition . [1] Caller has medicine question about med NOT prescribed by PCP AND [2] triager unable to answer question (e.g., compatibility with other med, storage)  Answer Assessment - Initial Assessment Questions 1. NAME of MEDICATION: "What medicine are you calling about?"     Sertraline- on 2-3 months- stopped 1 month ago- WD -brain zaps, sad for 1 week, anger issues back, therapy has stopped- could not afford treatment- so provider stopped Rx approval 2. QUESTION: "What is your question?" (e.g., double dose of medicine, side effect)     Face tingling at mouth for about one week- patient wants to know if this could at all be related to stopping medication- although it has been 1 month. 3. PRESCRIBING HCP: "Who prescribed it?" Reason: if prescribed by specialist, call should be referred to that group.     Therapist - [facility]- no longer seeing them due to cost 4. SYMPTOMS: "Do you have any symptoms?"     Face tingling around the mouth for 1 week- she wants to know if this could be part of medication WD 5. SEVERITY: If symptoms are present, ask "Are they mild, moderate or severe?"     Comes and goes- really bothersome when occurring 6. PREGNANCY:  "Is there any chance that you are pregnant?" "When was your last menstrual period?"     Not asked  Protocols used: Medication Question Call-A-AH

## 2021-06-14 NOTE — Telephone Encounter (Signed)
Pt was informed of [employer], pt stated she is still currently experiencing numbness. I suggested for her to be seen if shes highly concern. Pt stated will call office to make an appointment.

## 2021-06-14 NOTE — Telephone Encounter (Signed)
Medication was not prescribed by you [name].

## 2021-07-14 ENCOUNTER — Encounter: Payer: Self-pay | Admitting: Family Medicine

## 2021-07-14 ENCOUNTER — Ambulatory Visit (INDEPENDENT_AMBULATORY_CARE_PROVIDER_SITE_OTHER): Payer: BC Managed Care – PPO | Admitting: Family Medicine

## 2021-07-14 VITALS — BP 116/72 | HR 91 | Temp 98.0°F | Resp 16 | Ht 60.0 in | Wt 138.8 lb

## 2021-07-14 DIAGNOSIS — N6452 Nipple discharge: Secondary | ICD-10-CM | POA: Diagnosis not present

## 2021-07-14 LAB — PROLACTIN: Prolactin: 8.9 ng/mL

## 2021-07-14 LAB — TSH: TSH: 1.18 mIU/L

## 2021-07-14 NOTE — Progress Notes (Signed)
   SUBJECTIVE:   CHIEF COMPLAINT / HPI:   BREAST LUMP Duration : 2.5 weeks Location: right Frequency:  once had burning sensation at nipple Redness: no Swelling: no Trauma: no trauma Breastfeeding: no Associated with menstral cycle: no Nipple discharge:  a little, clear . Once Rashes: no Treatments attempted: none Previous mammogram: no 2 paternal aunts with breast cancer in 46s. No h/o breast cancer in first degree relatives. Has mirena in place, occasional spotting.    OBJECTIVE:   BP 116/72   Pulse 91   Temp 98 F (36.7 C)   Resp 16   Ht 5' (1.524 m)   Wt 138 lb 12.8 oz (63 kg)   SpO2 97%   BMI 27.11 kg/m   Gen: well appearing, in NAD Breasts: breasts appear normal, no suspicious masses, no nipple changes or axillary nodes. Small ~62mm epidermal inclusion nodule noted to L upper breast without overlying erythema, rash, discharge.   ASSESSMENT/PLAN:   Breast concern Epidermal inclusion cyst/nodule noted to L upper breast, very small, ~46mm. No abnormalities in breast tissue, reassurance provided. Will obtain labs given h/o nipple discharge. Plan for breast cancer screening at age 30.   Caro Laroche, DO

## 2021-07-14 NOTE — Patient Instructions (Signed)
It was great to see you!  Our plans for today:  - Try warm compresses if the nodule gets irritated. - We are checking some labs today, we will release these results to your MyChart. - We will plan to start screening for breast cancer at age 30.  Take care and seek immediate care sooner if you develop any concerns.   Dr. Linwood Dibbles

## 2021-07-16 NOTE — Telephone Encounter (Signed)
Mirena rcvd/charged 07/23/2019

## 2021-07-20 DIAGNOSIS — H3552 Pigmentary retinal dystrophy: Secondary | ICD-10-CM | POA: Diagnosis not present

## 2021-10-11 ENCOUNTER — Encounter: Payer: Self-pay | Admitting: Internal Medicine

## 2021-10-11 ENCOUNTER — Ambulatory Visit (INDEPENDENT_AMBULATORY_CARE_PROVIDER_SITE_OTHER): Payer: BC Managed Care – PPO | Admitting: Internal Medicine

## 2021-10-11 VITALS — BP 116/72 | HR 91 | Temp 97.8°F | Resp 16 | Ht 60.0 in | Wt 134.1 lb

## 2021-10-11 DIAGNOSIS — Z1159 Encounter for screening for other viral diseases: Secondary | ICD-10-CM

## 2021-10-11 DIAGNOSIS — Z0001 Encounter for general adult medical examination with abnormal findings: Secondary | ICD-10-CM

## 2021-10-11 NOTE — Progress Notes (Signed)
Name: Sabrina Dyer   MRN: [id number]    DOB: 01-Feb-1991   Date:10/11/2021       Progress Note  Subjective  Chief Complaint  Chief Complaint  Patient presents with   Annual Exam    HPI  Patient presents for annual CPE.  Diet: decrease volume in food due to nausea  Exercise: no routine exercise    Flowsheet Row Office Visit from 12/29/2020 in [facility]  AUDIT-C Score 1      Depression: Phq 9 is  negative Depression screen [facility] 2/9 10/11/2021 07/14/2021 05/05/2021 12/29/2020 10/09/2020  Decreased Interest 0 1 1 1 2   Down, Depressed, Hopeless 1 0 0 0 1  PHQ - 2 Score 1 1 1 1 3   Altered sleeping 0 0 0 1 2  Tired, decreased energy 1 0 0 1 3  Change in appetite 1 0 0 0 0  Feeling bad or failure about yourself  0 0 0 0 0  Trouble concentrating 3 0 0 3 3  Moving slowly or fidgety/restless 0 0 0 0 0  Suicidal thoughts 0 0 0 0 0  PHQ-9 Score 6 1 1 6 11   Difficult doing work/chores Somewhat difficult Not difficult at all Not difficult at all Somewhat difficult Somewhat difficult  Some recent data might be hidden   Hypertension: BP Readings from Last 3 Encounters:  10/11/21 116/72  07/14/21 116/72  05/05/21 114/78   Obesity: Wt Readings from Last 3 Encounters:  10/11/21 134 lb 1.6 oz (60.8 kg)  07/14/21 138 lb 12.8 oz (63 kg)  05/05/21 139 lb 14.4 oz (63.5 kg)   BMI Readings from Last 3 Encounters:  10/11/21 26.19 kg/m  07/14/21 27.11 kg/m  05/05/21 27.32 kg/m     Vaccines:   HPV: n/a Tdap: UTD Shingrix: n/a Pneumonia: n/a Flu: politely declines  Hep C Screening: due today STD testing and prevention (HIV/chl/gon/syphilis): no concerns Intimate partner violence: negative Menstrual History/LMP/Abnormal Bleeding: IUD Discussed importance of follow up if any post-menopausal bleeding: not applicable Incontinence Symptoms: No.  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 Patient does not qualify.    Advanced Care Planning: A voluntary discussion about advance care planning including the explanation and discussion of advance directives.  Discussed health care proxy and Living will, and the patient was able to identify a health care proxy as sister Shoua Ulloa.  Patient does not have a living will.   Lipids: Lab Results  Component Value Date   CHOL 134 10/04/2019   Lab Results  Component Value Date   HDL 48 (L) 10/04/2019   Lab Results  Component Value Date   LDLCALC 73 10/04/2019   Lab Results  Component Value Date   TRIG 45 10/04/2019   Lab Results  Component Value Date   CHOLHDL 2.8 10/04/2019   No results found for: LDLDIRECT  Glucose: Glucose, Bld  Date Value Ref Range Status  05/05/2021 87 65 - 99 mg/dL Final    Comment:    .  Fasting reference interval .   10/09/2020 88 65 - 99 mg/dL Final    Comment:    .            Fasting reference interval .   10/04/2019 95 65 - 99 mg/dL Final    Comment:    .            Fasting reference interval .     There are no problems to display for this patient.   Past Surgical History:  Procedure Laterality Date   CHOLECYSTECTOMY      Family History  Problem Relation Age of Onset   Hypertension Mother    Diabetes Mother    Heart attack Mother 76   COPD Maternal Grandmother    Emphysema Maternal Grandmother    Diabetes Paternal Grandfather    Breast cancer Paternal Aunt 41       has contact   Breast cancer Paternal Aunt 59       has contact    Social  History   Socioeconomic History   Marital status: Single    Spouse name: Not on file   Number of children: 3   Years of education: Not on file   Highest education level: Some college, no degree  Occupational History   Not on file  Tobacco Use   Smoking status: Never   Smokeless tobacco: Never  Vaping Use   Vaping Use: Never used  Substance and Sexual Activity   Alcohol use: Yes    Alcohol/week: 1.0 standard drink    Types: 1 Shots of liquor per week   Drug use: No   Sexual activity: Not Currently    Birth control/protection: I.U.D.    Comment: Mirena  Other Topics Concern   Not on file  Social History Narrative   Has a 31 year old boy, 31 year old boy and daughter 37 years old.   Single mother   Social Determinants of Corporate investment banker Strain: Low Risk    Difficulty of Paying Living Expenses: Not hard at all  Food Insecurity: No Food Insecurity   Worried About Programme researcher, broadcasting/film/video in the Last Year: Never true   Barista in the Last Year: Never true  Transportation Needs: No Transportation Needs   Lack of Transportation (Medical): No   Lack of Transportation (Non-Medical): No  Physical Activity: Inactive   Days of Exercise per Week: 0 days   Minutes of Exercise per Session: 0 min  Stress: Stress Concern Present   Feeling of Stress : Rather much  Social Connections: Socially Isolated   Frequency of Communication with Friends and Family: Three times a week   Frequency of Social Gatherings with Friends and Family: Three times a week   Attends Religious Services: Never   Active Member of Clubs or Organizations: No   Attends Banker Meetings: Never   Marital Status: Never married  Catering manager Violence: Not At Risk   Fear of Current or Ex-Partner: No   Emotionally Abused: No   Physically Abused: No   Sexually Abused: No     Current Outpatient Medications:    hydrOXYzine (ATARAX/VISTARIL) 25 MG tablet, 25 mg PO TID PRN for anxiety  and/or 25-50 mg PO at bedtime PRN for insomnia (Patient not taking: Reported on 10/11/2021), Disp: 60 tablet, Rfl: 1   levonorgestrel (MIRENA) 20 MCG/24HR IUD, 1 Intra Uterine Device (1 each total) by Intrauterine route once for 1 dose., Disp: 1 each, Rfl:  0   pantoprazole (PROTONIX) 40 MG tablet, Take 1 tablet (40 mg total) by mouth daily. (Patient not taking: Reported on 10/11/2021), Disp: 30 tablet, Rfl: 3   sertraline (ZOLOFT) 100 MG tablet, Take by mouth., Disp: , Rfl:   No Known Allergies   Review of Systems  Constitutional: Negative.   Respiratory: Negative.    Cardiovascular: Negative.   Gastrointestinal:  Positive for nausea.    Objective  Vitals:   10/11/21 1415  BP: 116/72  Pulse: 91  Resp: 16  Temp: 97.8 F (36.6 C)  TempSrc: Oral  SpO2: 99%  Weight: 134 lb 1.6 oz (60.8 kg)  Height: 5' (1.524 m)    Body mass index is 26.19 kg/m.  Physical Exam Constitutional:      Appearance: Normal appearance.  HENT:     Head: Normocephalic and atraumatic.     Mouth/Throat:     Mouth: Mucous membranes are moist.     Pharynx: Oropharynx is clear.  Eyes:     Conjunctiva/sclera: Conjunctivae normal.  Cardiovascular:     Rate and Rhythm: Normal rate and regular rhythm.  Pulmonary:     Effort: Pulmonary effort is normal.     Breath sounds: Normal breath sounds.  Musculoskeletal:     Right lower leg: No edema.     Left lower leg: No edema.  Skin:    General: Skin is warm and dry.  Neurological:     General: No focal deficit present.     Mental Status: She is alert. Mental status is at baseline.  Psychiatric:        Mood and Affect: Mood normal.        Behavior: Behavior normal.    Recent Results (from the past 2160 hour(s))  Prolactin     Status: None   Collection Time: 07/14/21  3:00 PM  Result Value Ref Range   Prolactin 8.9 ng/mL    Comment:             Reference Range  Females         Non-pregnant        3.0-30.0         Pregnant           10.0-209.0          Postmenopausal      2.0-20.0 . . .   TSH     Status: None   Collection Time: 07/14/21  3:00 PM  Result Value Ref Range   TSH 1.18 mIU/L    Comment:           Reference Range .           > or = 20 Years  0.40-4.50 .                Pregnancy Ranges           First trimester    0.26-2.66           Second trimester   0.55-2.73           Third trimester    0.43-2.91      Fall Risk: Fall Risk  10/11/2021 07/14/2021 05/05/2021 12/29/2020 10/09/2020  Falls in the past year? 0 0 0 1 0  Number falls in past yr: 0 0 0 0 0  Injury with Fall? 0 0 0 1 0  Risk for fall due to : No Fall Risks - - History of fall(s) -  Follow up Falls prevention discussed - - Falls evaluation  completed -     Functional Status Survey: Is the patient deaf or have difficulty hearing?: No Does the patient have difficulty seeing, even when wearing glasses/contacts?: Yes Does the patient have difficulty concentrating, remembering, or making decisions?: No Does the patient have difficulty walking or climbing stairs?: No Does the patient have difficulty dressing or bathing?: No Does the patient have difficulty doing errands alone such as visiting a doctor's office or shopping?: No   Assessment & Plan  1. Encounter for general adult medical examination with abnormal findings: Screening labs today.  - CBC w/Diff/Platelet - COMPLETE METABOLIC PANEL WITH GFR - Lipid Profile  2. Need for hepatitis C screening test  - Hepatitis C Antibody   -USPSTF grade A and B recommendations reviewed with patient; age-appropriate recommendations, preventive care, screening tests, etc discussed and encouraged; healthy living encouraged; see AVS for patient education given to patient -Discussed importance of 150 minutes of physical activity weekly, eat two servings of fish weekly, eat one serving of tree nuts ( cashews, pistachios, pecans, almonds.Marland Kitchen) every other day, eat 6 servings of fruit/vegetables daily and drink plenty of  water and avoid sweet beverages.   -Reviewed Health Maintenance: Yes.

## 2021-10-11 NOTE — Patient Instructions (Addendum)
It was great seeing you today!  Plan discussed at today's visit: -Blood work ordered today, results will be uploaded to MyChart.  -Hydrocortisone cream for itching, but may change pigment. Can also try over the counter anti-histamine Benadryl cream.   Follow up in: 1 year  Take care and let us know if you have any questions or concerns prior to your next visit.  Dr. Caralee Ates   Health Maintenance, Female Adopting a healthy lifestyle and getting preventive care are important in promoting health and wellness. Ask your health care provider about: The right schedule for you to have regular tests and exams. Things you can do on your own to prevent diseases and keep yourself healthy. What should I know about diet, weight, and exercise? Eat a healthy diet  Eat a diet that includes plenty of vegetables, fruits, low-fat dairy products, and lean protein. Do not eat a lot of foods that are high in solid fats, added sugars, or sodium. Maintain a healthy weight Body mass index (BMI) is used to identify weight problems. It estimates body fat based on height and weight. Your health care provider can help determine your BMI and help you achieve or maintain a healthy weight. Get regular exercise Get regular exercise. This is one of the most important things you can do for your health. Most adults should: Exercise for at least 150 minutes each week. The exercise should increase your heart rate and make you sweat (moderate-intensity exercise). Do strengthening exercises at least twice a week. This is in addition to the moderate-intensity exercise. Spend less time sitting. Even light physical activity can be beneficial. Watch cholesterol and blood lipids Have your blood tested for lipids and cholesterol at 31 years of age, then have this test every 5 years. Have your cholesterol levels checked more often if: Your lipid or cholesterol levels are high. You are older than 31 years of age. You are at high  risk for heart disease. What should I know about cancer screening? Depending on your health history and family history, you may need to have cancer screening at various ages. This may include screening for: Breast cancer. Cervical cancer. Colorectal cancer. Skin cancer. Lung cancer. What should I know about heart disease, diabetes, and high blood pressure? Blood pressure and heart disease High blood pressure causes heart disease and increases the risk of stroke. This is more likely to develop in people who have high blood pressure readings or are overweight. Have your blood pressure checked: Every 3-5 years if you are 39-39 years of age. Every year if you are 34 years old or older. Diabetes Have regular diabetes screenings. This checks your fasting blood sugar level. Have the screening done: Once every three years after age 2 if you are at a normal weight and have a low risk for diabetes. More often and at a younger age if you are overweight or have a high risk for diabetes. What should I know about preventing infection? Hepatitis B If you have a higher risk for hepatitis B, you should be screened for this virus. Talk with your health care provider to find out if you are at risk for hepatitis B infection. Hepatitis C Testing is recommended for: Everyone born from [birth year] through 1965. Anyone with known risk factors for hepatitis C. Sexually transmitted infections (STIs) Get screened for STIs, including gonorrhea and chlamydia, if: You are sexually active and are younger than 31 years of age. You are older than 31 years of age and your health  care provider tells you that you are at risk for this type of infection. Your sexual activity has changed since you were last screened, and you are at increased risk for chlamydia or gonorrhea. Ask your health care provider if you are at risk. Ask your health care provider about whether you are at high risk for HIV. Your health care provider may  recommend a prescription medicine to help prevent HIV infection. If you choose to take medicine to prevent HIV, you should first get tested for HIV. You should then be tested every 3 months for as long as you are taking the medicine. Pregnancy If you are about to stop having your period (premenopausal) and you may become pregnant, seek counseling before you get pregnant. Take 400 to 800 micrograms (mcg) of folic acid every day if you become pregnant. Ask for birth control (contraception) if you want to prevent pregnancy. Osteoporosis and menopause Osteoporosis is a disease in which the bones lose minerals and strength with aging. This can result in bone fractures. If you are 21 years old or older, or if you are at risk for osteoporosis and fractures, ask your health care provider if you should: Be screened for bone loss. Take a calcium or vitamin D supplement to lower your risk of fractures. Be given hormone replacement therapy (HRT) to treat symptoms of menopause. Follow these instructions at home: Alcohol use Do not drink alcohol if: Your health care provider tells you not to drink. You are pregnant, may be pregnant, or are planning to become pregnant. If you drink alcohol: Limit how much you have to: 0-1 drink a day. Know how much alcohol is in your drink. In the [place] one drink equals one 12 oz bottle of beer (355 mL), one 5 oz glass of wine (148 mL), or one 1 oz glass of hard liquor (44 mL). Lifestyle Do not use any products that contain nicotine or tobacco. These products include cigarettes, chewing tobacco, and vaping devices, such as e-cigarettes. If you need help quitting, ask your health care provider. Do not use street drugs. Do not share needles. Ask your health care provider for help if you need support or information about quitting drugs. General instructions Schedule regular health, dental, and eye exams. Stay current with your vaccines. Tell your health care provider  if: You often feel depressed. You have ever been abused or do not feel safe at home. Summary Adopting a healthy lifestyle and getting preventive care are important in promoting health and wellness. Follow your health care provider's instructions about healthy diet, exercising, and getting tested or screened for diseases. Follow your health care provider's instructions on monitoring your cholesterol and blood pressure. This information is not intended to replace advice given to you by your health care provider. Make sure you discuss any questions you have with your health care provider. Document Revised: 12/21/2020 Document Reviewed: 12/21/2020 Elsevier Patient Education  2022 [employer].

## 2021-10-12 LAB — COMPLETE METABOLIC PANEL WITH GFR
AG Ratio: 1.6 (calc) (ref 1.0–2.5)
ALT: 11 U/L (ref 6–29)
AST: 18 U/L (ref 10–30)
Albumin: 4.4 g/dL (ref 3.6–5.1)
Alkaline phosphatase (APISO): 69 U/L (ref 31–125)
BUN: 21 mg/dL (ref 7–25)
CO2: 25 mmol/L (ref 20–32)
Calcium: 9.8 mg/dL (ref 8.6–10.2)
Chloride: 104 mmol/L (ref 98–110)
Creat: 0.68 mg/dL (ref 0.50–0.97)
Globulin: 2.8 g/dL (calc) (ref 1.9–3.7)
Glucose, Bld: 90 mg/dL (ref 65–99)
Potassium: 4.4 mmol/L (ref 3.5–5.3)
Sodium: 137 mmol/L (ref 135–146)
Total Bilirubin: 0.5 mg/dL (ref 0.2–1.2)
Total Protein: 7.2 g/dL (ref 6.1–8.1)
eGFR: 120 mL/min/{1.73_m2} (ref >=60)

## 2021-10-12 LAB — LIPID PANEL
Cholesterol: 146 mg/dL (ref <200)
HDL: 48 mg/dL — ABNORMAL LOW (ref >=50)
LDL Cholesterol (Calc): 82 mg/dL (calc)
Non-HDL Cholesterol (Calc): 98 mg/dL (calc) (ref <130)
Total CHOL/HDL Ratio: 3 (calc) (ref <5.0)
Triglycerides: 84 mg/dL (ref <150)

## 2021-10-12 LAB — CBC WITH DIFFERENTIAL/PLATELET
Absolute Monocytes: 521 cells/uL (ref 200–950)
Basophils Absolute: 63 cells/uL (ref 0–200)
Basophils Relative: 0.8 %
Eosinophils Absolute: 292 cells/uL (ref 15–500)
Eosinophils Relative: 3.7 %
HCT: 37.6 % (ref 35.0–45.0)
Hemoglobin: 12.3 g/dL (ref 11.7–15.5)
Lymphs Abs: 2378 cells/uL (ref 850–3900)
MCH: 27.8 pg (ref 27.0–33.0)
MCHC: 32.7 g/dL (ref 32.0–36.0)
MCV: 85.1 fL (ref 80.0–100.0)
MPV: 9.8 fL (ref 7.5–12.5)
Monocytes Relative: 6.6 %
Neutro Abs: 4645 cells/uL (ref 1500–7800)
Neutrophils Relative %: 58.8 %
Platelets: 393 10*3/uL (ref 140–400)
RBC: 4.42 10*6/uL (ref 3.80–5.10)
RDW: 12.9 % (ref 11.0–15.0)
Total Lymphocyte: 30.1 %
WBC: 7.9 10*3/uL (ref 3.8–10.8)

## 2021-10-12 LAB — HEPATITIS C ANTIBODY
Hepatitis C Ab: NONREACTIVE
SIGNAL TO CUT-OFF: <0.02 (ref <1.00)

## 2021-10-13 ENCOUNTER — Other Ambulatory Visit: Payer: Self-pay

## 2021-10-13 ENCOUNTER — Ambulatory Visit (INDEPENDENT_AMBULATORY_CARE_PROVIDER_SITE_OTHER): Payer: BC Managed Care – PPO | Admitting: Licensed Practical Nurse

## 2021-10-13 ENCOUNTER — Other Ambulatory Visit (HOSPITAL_COMMUNITY)
Admission: RE | Admit: 2021-10-13 | Discharge: 2021-10-13 | Disposition: A | Discharge status: Home / Self Care | Payer: BC Managed Care – PPO | Source: Ambulatory Visit | Attending: Licensed Practical Nurse | Admitting: Licensed Practical Nurse

## 2021-10-13 ENCOUNTER — Encounter: Payer: Self-pay | Admitting: Licensed Practical Nurse

## 2021-10-13 VITALS — BP 112/70 | Ht 60.0 in | Wt 134.4 lb

## 2021-10-13 DIAGNOSIS — N907 Vulvar cyst: Secondary | ICD-10-CM | POA: Diagnosis not present

## 2021-10-13 DIAGNOSIS — Z01419 Encounter for gynecological examination (general) (routine) without abnormal findings: Secondary | ICD-10-CM | POA: Diagnosis present

## 2021-10-13 DIAGNOSIS — Z124 Encounter for screening for malignant neoplasm of cervix: Secondary | ICD-10-CM | POA: Diagnosis present

## 2021-10-13 NOTE — Progress Notes (Signed)
? ? ? ?Gynecology Annual Exam   ?PCP: Margarita Mail, DO ? ?Chief Complaint:  ?Chief Complaint  ?Patient presents with  ? Gynecologic Exam  ?  Annual Exam  ? ? ?History of Present Illness: Patient is a 31 y.o. [name] presents for annual exam. The patient has no complaints today. She was recently seen by her PCP for a PE. Has a small bump on her Right breast, has been there x 1 year, was told there is nothing to  be concerned about, it has gotten smaller in size. Has a small bump on her right labia, has been there a while-it is not painful and has not changed.  ? ?LMP: No LMP recorded. (Menstrual status: IUD). ?Rarely gets a cycle  ? ?The patient is not currently sexually active. She currently uses IUD for contraception. She denies dyspareunia.  The patient does perform self breast exams.  There is notable family history of breast or ovarian cancer in her family-her father's 2 sisters ?Does not use condoms when she does have intercourse ? ?The patient wears seatbelts: yes.   The patient has regular exercise: no-but is always on the go.  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 Describes stress at "9/10" but managing, likes to go outdoors and deep breath to relieve stress.  ? ?The patient denies current symptoms of depression.   ? ?Review of Systems: Review of Systems  ?Respiratory: Negative.    ?Cardiovascular: Negative.   ?Gastrointestinal: Negative.   ?Genitourinary: Negative.   ?Psychiatric/Behavioral: Negative.    ? ?Past Medical History:  ?There are no problems to display for this patient. ? ? ?Past Surgical History:  ?Past Surgical History:  ?Procedure Laterality Date  ? CHOLECYSTECTOMY    ? ? ?Gynecologic History:  ?No LMP recorded. (Menstrual status: IUD). ?Contraception: IUD ?Last Pap: Results were:2022  low-grade squamous intraepithelial neoplasia (LGSIL - encompassing HPV,mild dysplasia,CIN I)  ? ?Obstetric History:  [id number] ? ?Family History:  ?Family History  ?Problem Relation Age of Onset  ? Hypertension Mother   ? Diabetes Mother   ? Heart attack Mother 69  ? COPD Maternal Grandmother   ? Emphysema Maternal Grandmother   ? Diabetes Paternal Grandfather   ? Breast cancer Paternal Aunt 7  ?     has contact  ? Breast cancer Paternal Aunt 29  ?     has contact  ? ? ?Social History:  ?Social History  ? ?Socioeconomic History  ? Marital status: Single  ?  Spouse name: Not on file  ? Number of children: 3  ? Years of education: Not on file  ? Highest education level: Some college, no degree  ?Occupational History  ? Not on file  ?Tobacco Use  ? Smoking status: Never  ? Smokeless tobacco: Never  ?Vaping Use  ? Vaping Use: Never used  ?Substance and Sexual Activity  ? Alcohol use: Yes  ?  Alcohol/week: 1.0 standard drink  ?  Types: 1 Shots of liquor per week  ? Drug use: No  ? Sexual activity: Not Currently  ?  Birth control/protection: I.U.D.  ?  Comment: Mirena  ?Other Topics Concern  ? Not on file  ?Social History Narrative  ? Has a 30 year old boy, 31 year old boy and daughter 62 years old.  ? Single mother  ? ?Social Determinants of Health  ? ?Financial Resource Strain: Low Risk   ? Difficulty of Paying Living  Expenses: Not hard at all  ?Food Insecurity: No Food Insecurity  ? Worried About Programme researcher, broadcasting/film/video in the Last Year: Never true  ? Ran Out of Food in the Last Year: Never true  ?Transportation Needs: No Transportation Needs  ? Lack of Transportation (Medical): No  ? Lack of Transportation (Non-Medical): No  ?Physical Activity: Inactive  ? Days of Exercise per Week: 0 days  ? Minutes of Exercise per Session: 0 min  ?Stress: Stress Concern Present  ? Feeling of Stress : Rather much  ?Social Connections: Socially Isolated  ? Frequency of Communication with Friends and Family: Three times a week  ? Frequency of Social Gatherings with Friends and Family: Three times a week  ? Attends Religious Services: Never  ? Active Member  of Clubs or Organizations: No  ? Attends Banker Meetings: Never  ? Marital Status: Never married  ?Intimate Partner Violence: Not At Risk  ? Fear of Current or Ex-Partner: No  ? Emotionally Abused: No  ? Physically Abused: No  ? Sexually Abused: No  ? ? ?Allergies:  ?No Known Allergies ? ?Medications: ?Prior to Admission medications   ?Medication Sig Start Date End Date Taking? Authorizing Provider  ?levonorgestrel (MIRENA) 20 MCG/24HR IUD 1 Intra Uterine Device (1 each total) by Intrauterine route once for 1 dose. 07/23/19 07/23/19  Copland, Ilona Sorrel, PA-C  ? ? ?Physical Exam ?Vitals: Blood pressure 112/70, height 5' (1.524 m), weight 134 lb 6.4 oz (61 kg). ? ?General: NAD ?HEENT: normocephalic, anicteric ?Thyroid: no enlargement, no palpable nodules ?Pulmonary: No increased work of breathing, CTAB ?Cardiovascular: RRR, distal pulses 2+ ?Breast: Breast symmetrical, no tenderness, no palpable nodules or masses, pinpoint cyst on surface of Right breast above areola at 11 O'clock, no skin or nipple retraction present, no nipple discharge.  No axillary or supraclavicular lymphadenopathy. ?Abdomen: NABS, soft, non-tender, non-distended.  Umbilicus without lesions.  No hepatomegaly, splenomegaly or masses palpable. No evidence of hernia  ?Genitourinary: ? External: Normal external female genitalia-small 27mm raised fluid filled area with a pinpoint pustule adjacent to it in Right inner labia. Normal urethral meatus, normal Bartholin's and Skene's glands.   ? Vagina: Normal vaginal mucosa, no evidence of prolapse.   ? Cervix: Grossly normal in appearance, no bleeding, 3 cysts present at 1 o'clock, IUD strings visible ? Uterus: Non-enlarged, mobile, normal contour.  No CMT ? Adnexa: ovaries non-enlarged, no adnexal masses ? Rectal: deferred ? Lymphatic: no evidence of inguinal lymphadenopathy ?Extremities: no edema, erythema, or tenderness ?Neurologic: Grossly intact ?Psychiatric: mood appropriate, affect  full ? ? ?Assessment: 31 y.o. [id number] routine annual exam ?Cervical cancer screening  ?Plan: ?Problem List Items Addressed This Visit   ?None ? ? ?2) STI screening  wasoffered and declined ? ?2)  ASCCP guidelines and rational discussed.  Patient opts for yearly x2 years, then based on results screening interval ? ?3) Contraception - the patient is currently using  IUD.  She is happy with her current form of contraception and plans to continue ? ?4) Routine healthcare maintenance including cholesterol, diabetes screening discussed managed by PCP ? ?5) Labial cyst-rec evaluation by gyn dermatologist or [facility], lesions appear benign but I cannot say with certainty.  ? ?Carie Caddy, CNM  ?[facility] ?10/13/2021, 9:33 AM ? ? ?  ?

## 2021-10-18 ENCOUNTER — Ambulatory Visit (INDEPENDENT_AMBULATORY_CARE_PROVIDER_SITE_OTHER): Payer: BC Managed Care – PPO | Admitting: Internal Medicine

## 2021-10-18 ENCOUNTER — Encounter: Payer: Self-pay | Admitting: Internal Medicine

## 2021-10-18 VITALS — BP 110/70 | HR 90 | Temp 98.1°F | Resp 16 | Ht 60.0 in | Wt 133.5 lb

## 2021-10-18 DIAGNOSIS — R1084 Generalized abdominal pain: Secondary | ICD-10-CM

## 2021-10-18 LAB — CYTOLOGY - PAP
Chlamydia: NEGATIVE
Comment: NEGATIVE
Comment: NEGATIVE
Comment: NEGATIVE
Comment: NEGATIVE
Comment: NORMAL
HPV 16: NEGATIVE
HPV 18 / 45: NEGATIVE
High risk HPV: POSITIVE — AB
Neisseria Gonorrhea: NEGATIVE
Trichomonas: NEGATIVE

## 2021-10-18 NOTE — Progress Notes (Signed)
? ?Acute Office Visit ? ?Subjective:  ? ? Patient ID: Sabrina Dyer, female    DOB: 02-10-1991, 31 y.o.   MRN: [id number] ? ?Chief Complaint  ?Patient presents with  ? Abdominal Pain  ?  Stomach issues since having gallbladder removed  ? ? ?HPI ?Patient is in today for abdominal complaint. Gallbladder out 5 years ago, pain since then. Tries to eat healthy, skips breakfast. Avoids dairy and fried foods. States this pain is different from acid reflux she's had in the past, had been on a PPI in the past.  ? ?Abdominal Complaint: ?-Duration:  5 years  ?-Frequency: intermittent ?-Nature: bloating, cramping, and ill-defined ?-Location: diffuse, RUQ, and epigastric  ?-Severity: moderate  ?-Radiation: no ?-Alleviating factors: Nothing  ?-Aggravating factors: Eating, particularly fried foods but can be eating in general ?-Treatments attempted: none ?-Constipation: no ?-Diarrhea:  looser stools lately  ?-Episodes of diarrhea/day: 1-2 loose stools a day ?-Mucous in the stool: no ?-Heartburn: no ?-Bloating:yes ?-Nausea: yes ?-Vomiting: no ?-Melena or hematochezia: no ?-Fever: no ?-Weight loss: no ?-Change in Appetite: yes ? ? ?Past Medical History:  ?Diagnosis Date  ? Anemia   ? Medical history non-contributory   ? ? ?Past Surgical History:  ?Procedure Laterality Date  ? CHOLECYSTECTOMY    ? ? ?Family History  ?Problem Relation Age of Onset  ? Hypertension Mother   ? Diabetes Mother   ? Heart attack Mother 40  ? COPD Maternal Grandmother   ? Emphysema Maternal Grandmother   ? Diabetes Paternal Grandfather   ? Breast cancer Paternal Aunt 34  ?     has contact  ? Breast cancer Paternal Aunt 21  ?     has contact  ? ? ?Social History  ? ?Socioeconomic History  ? Marital status: Single  ?  Spouse name: Not on file  ? Number of children: 3  ? Years of education: Not on file  ? Highest education level: Some college, no degree  ?Occupational History  ? Not on file  ?Tobacco Use  ? Smoking status: Never  ? Smokeless tobacco: Never   ?Vaping Use  ? Vaping Use: Never used  ?Substance and Sexual Activity  ? Alcohol use: Yes  ?  Alcohol/week: 1.0 standard drink  ?  Types: 1 Shots of liquor per week  ? Drug use: No  ? Sexual activity: Not Currently  ?  Birth control/protection: I.U.D.  ?  Comment: Mirena  ?Other Topics Concern  ? Not on file  ?Social History Narrative  ? Has a 31 year old boy, 31 year old boy and daughter 58 years old.  ? Single mother  ? ?Social Determinants of Health  ? ?Financial Resource Strain: Low Risk   ? Difficulty of Paying Living Expenses: Not hard at all  ?Food Insecurity: No Food Insecurity  ? Worried About Charity fundraiser in the Last Year: Never true  ? Ran Out of Food in the Last Year: Never true  ?Transportation Needs: No Transportation Needs  ? Lack of Transportation (Medical): No  ? Lack of Transportation (Non-Medical): No  ?Physical Activity: Inactive  ? Days of Exercise per Week: 0 days  ? Minutes of Exercise per Session: 0 min  ?Stress: Stress Concern Present  ? Feeling of Stress : Rather much  ?Social Connections: Socially Isolated  ? Frequency of Communication with Friends and Family: Three times a week  ? Frequency of Social Gatherings with Friends and Family: Three times a week  ? Attends Religious Services: Never  ?  Active Member of Clubs or Organizations: No  ? Attends Archivist Meetings: Never  ? Marital Status: Never married  ?Intimate Partner Violence: Not At Risk  ? Fear of Current or Ex-Partner: No  ? Emotionally Abused: No  ? Physically Abused: No  ? Sexually Abused: No  ? ? ?Outpatient Medications Prior to Visit  ?Medication Sig Dispense Refill  ? levonorgestrel (MIRENA) 20 MCG/24HR IUD 1 Intra Uterine Device (1 each total) by Intrauterine route once for 1 dose. 1 each 0  ? ?No facility-administered medications prior to visit.  ? ? ?No Known Allergies ? ?Review of Systems  ?Constitutional:  Positive for appetite change. Negative for chills, fever and unexpected weight change.   ?Respiratory:  Negative for cough.   ?Cardiovascular:  Negative for chest pain.  ?Gastrointestinal:  Positive for abdominal distention, abdominal pain, diarrhea and nausea. Negative for blood in stool and vomiting.  ?Genitourinary:  Negative for dysuria, frequency and hematuria.  ?Skin:  Negative for rash.  ? ?   ?Objective:  ?  ?Physical Exam ?Constitutional:   ?   Appearance: She is well-developed.  ?HENT:  ?   Head: Normocephalic and atraumatic.  ?Eyes:  ?   Conjunctiva/sclera: Conjunctivae normal.  ?Cardiovascular:  ?   Rate and Rhythm: Normal rate and regular rhythm.  ?Pulmonary:  ?   Effort: Pulmonary effort is normal.  ?   Breath sounds: Normal breath sounds.  ?Abdominal:  ?   General: Bowel sounds are normal. There is no distension.  ?   Palpations: Abdomen is soft.  ?   Tenderness: There is abdominal tenderness. There is no right CVA tenderness, left CVA tenderness, guarding or rebound.  ?   Comments: Tenderness in bilateral lower quadrants to palpation   ?Musculoskeletal:  ?   Right lower leg: No edema.  ?   Left lower leg: No edema.  ?Skin: ?   General: Skin is warm and dry.  ?Neurological:  ?   General: No focal deficit present.  ?   Mental Status: She is alert. Mental status is at baseline.  ?Psychiatric:     ?   Mood and Affect: Mood normal.     ?   Behavior: Behavior normal.  ? ? ?BP 110/70   Pulse 90   Temp 98.1 ?F (36.7 ?C)   Resp 16   Ht 5' (1.524 m)   Wt 133 lb 8 oz (60.6 kg)   SpO2 98%   BMI 26.07 kg/m?  ?Wt Readings from Last 3 Encounters:  ?10/18/21 133 lb 8 oz (60.6 kg)  ?10/13/21 134 lb 6.4 oz (61 kg)  ?10/11/21 134 lb 1.6 oz (60.8 kg)  ? ? ?There are no preventive care reminders to display for this patient. ? ?There are no preventive care reminders to display for this patient. ? ? ?Lab Results  ?Component Value Date  ? TSH 1.18 07/14/2021  ? ?Lab Results  ?Component Value Date  ? WBC 7.9 10/11/2021  ? HGB 12.3 10/11/2021  ? HCT 37.6 10/11/2021  ? MCV 85.1 10/11/2021  ? PLT 393  10/11/2021  ? ?Lab Results  ?Component Value Date  ? NA 137 10/11/2021  ? K 4.4 10/11/2021  ? CO2 25 10/11/2021  ? GLUCOSE 90 10/11/2021  ? BUN 21 10/11/2021  ? CREATININE 0.68 10/11/2021  ? BILITOT 0.5 10/11/2021  ? AST 18 10/11/2021  ? ALT 11 10/11/2021  ? PROT 7.2 10/11/2021  ? CALCIUM 9.8 10/11/2021  ? EGFR 120 10/11/2021  ? ?Lab  Results  ?Component Value Date  ? CHOL 146 10/11/2021  ? ?Lab Results  ?Component Value Date  ? HDL 48 (L) 10/11/2021  ? ?Lab Results  ?Component Value Date  ? [place] 82 10/11/2021  ? ?Lab Results  ?Component Value Date  ? TRIG 84 10/11/2021  ? ?Lab Results  ?Component Value Date  ? CHOLHDL 3.0 10/11/2021  ? ?No results found for: HGBA1C ? ?   ?Assessment & Plan:  ? ?1. Generalized abdominal pain: Will obtain an ultrasound to assess RUQ however on exam tenderness was more in bilateral lower quadrants. Blood work recently was without abnormalities, no leukocytosis. Patient worried about gluten intolerance and will trial gluten free diet. Follow up in 1 month for recheck.  ? ?- US Abdomen Complete; Future ? ? ? ?[name], DO ? ?

## 2021-10-18 NOTE — Patient Instructions (Signed)
It was great seeing you today! ? ?Plan discussed at today's visit: ?-Abdominal ultrasound ordered today ?-Trial gluten free diet  ? ?Follow up in: 1 month  ? ?Take care and let us know if you have any questions or concerns prior to your next visit. ? ?Dr. Caralee Ates ? ?

## 2021-10-25 ENCOUNTER — Ambulatory Visit
Admission: RE | Admit: 2021-10-25 | Discharge: 2021-10-25 | Disposition: A | Discharge status: Home / Self Care | Payer: BC Managed Care – PPO | Source: Ambulatory Visit | Attending: Internal Medicine | Admitting: Internal Medicine

## 2021-10-25 ENCOUNTER — Other Ambulatory Visit: Payer: Self-pay

## 2021-10-25 DIAGNOSIS — R1084 Generalized abdominal pain: Secondary | ICD-10-CM | POA: Insufficient documentation

## 2021-10-27 ENCOUNTER — Encounter: Payer: Self-pay | Admitting: Obstetrics and Gynecology

## 2021-11-01 ENCOUNTER — Telehealth: Payer: Self-pay

## 2021-11-01 NOTE — Telephone Encounter (Signed)
Pt calling; has seen pap smear results and knows they are abnormal; wants to know what the next step is - repeat in a year of colposcopy.  [phone] ?

## 2021-11-01 NOTE — Telephone Encounter (Signed)
Pt aware.

## 2021-11-09 ENCOUNTER — Ambulatory Visit: Payer: Self-pay | Admitting: *Deleted

## 2021-11-09 NOTE — Telephone Encounter (Signed)
?  Chief Complaint: skin irritation ?Symptoms: itching, rash ?Frequency: 1 month ?Pertinent Negatives: Patient denies   ?Disposition: [] ED /[] Urgent Care (no appt availability in office) / [x] Appointment(In office/virtual)/ []  [facility]/ [] Home Care/ [] Refused Recommended Disposition /[] [facility]/ []  Follow-up with PCP ?Additional Notes:    ?

## 2021-11-09 NOTE — Telephone Encounter (Signed)
Summary: Allergic reaction  ? Patient called in stated that she have done a tattoo about a month now on her arm and she normally does not have a reaction but now she have swelling, dry flaky and itching around the area of the tattoo. Patient think this is an allergic reaction and need to know what to do. Can be reached at  Ph# [phone]   ?  ?2 ?Reason for Disposition ? Localized rash present > 7 days ? ?Answer Assessment - Initial Assessment Questions ?1. APPEARANCE of RASH: "Describe the rash."  ?    Skin is bumpy and itching ?2. LOCATION: "Where is the rash located?"  ?    L forearm ?3. NUMBER: "How many spots are there?"  ?    multiple ?4. SIZE: "How big are the spots?" (Inches, centimeters or compare to size of a coin)  ?    Pin prick size ?5. ONSET: "When did the rash start?"  ?    1 month ago ?6. ITCHING: "Does the rash itch?" If Yes, ask: "How bad is the itch?"  (Scale 0-10; or none, mild, moderate, severe) ?    Yes- severe ?7. PAIN: "Does the rash hurt?" If Yes, ask: "How bad is the pain?"  (Scale 0-10; or none, mild, moderate, severe) ?   - NONE (0): no pain ?   - MILD (1-3): doesn't interfere with normal activities  ?   - MODERATE (4-7): interferes with normal activities or awakens from sleep  ?   - SEVERE (8-10): excruciating pain, unable to do any normal activities ?    no ?8. OTHER SYMPTOMS: "Do you have any other symptoms?" (e.g., fever) ?    no ?9. PREGNANCY: "Is there any chance you are pregnant?" "When was your last menstrual period?" ?    No- LMP- IUD ? ?Protocols used: Rash or Redness - Localized-A-AH ? ?

## 2021-11-10 ENCOUNTER — Encounter: Payer: Self-pay | Admitting: Internal Medicine

## 2021-11-10 ENCOUNTER — Ambulatory Visit (INDEPENDENT_AMBULATORY_CARE_PROVIDER_SITE_OTHER): Payer: BC Managed Care – PPO | Admitting: Internal Medicine

## 2021-11-10 VITALS — BP 102/60 | HR 99 | Temp 98.4°F | Resp 16 | Ht 60.0 in | Wt 129.4 lb

## 2021-11-10 DIAGNOSIS — T7840XA Allergy, unspecified, initial encounter: Secondary | ICD-10-CM

## 2021-11-10 DIAGNOSIS — R21 Rash and other nonspecific skin eruption: Secondary | ICD-10-CM | POA: Diagnosis not present

## 2021-11-10 MED ORDER — METHYLPREDNISOLONE 4 MG PO TBPK: ORAL_TABLET | ORAL | 0 refills | Status: DC

## 2021-11-10 NOTE — Patient Instructions (Addendum)
It was great seeing you today! ? ?Plan discussed at today's visit: ?-Continue to use heavy duty moisturizer at least twice a day ?-Continue to use Benadryl cream ?-Medrol dose pack sent to pharmacy ?-Start taking anti-histamine, Claritin, Allegra or Zyrtec (might make drowsy so can take at night)  ? ?Follow up in: as needed  ? ?Take care and let us know if you have any questions or concerns prior to your next visit. ? ?Dr. Caralee Ates ? ?

## 2021-11-10 NOTE — Progress Notes (Signed)
? ?Acute Office Visit ? ?Subjective:  ? ? Patient ID: Sabrina Dyer, female    DOB: [birth year], 31 y.o.   MRN: [id number] ? ?Chief Complaint  ?Patient presents with  ? Rash  ?  Tattoo site on left arm, mainly itches on going for a month  ? ? ?HPI ?Patient is in today for rash around tattoo. She has had this issue for about 1 month and she's had the tattoo done about a month and half ago. She has not had this issue with other tattoos in the past. The rash is not spreading. It is not changing and is only located under the pigment of the tattoo, worse appearing in the darker pigments. Rash is raised, no blisters, no drainage. Very itching, no pain. No other skin changes, no fevers. Tried Benadryl cream and Dermsil lotion.  ? ?Past Medical History:  ?Diagnosis Date  ? Anemia   ? Family history of breast cancer   ? 3/23 cancer genetic testing letter sent  ? Medical history non-contributory   ? ? ?Past Surgical History:  ?Procedure Laterality Date  ? CHOLECYSTECTOMY    ? ? ?Family History  ?Problem Relation Age of Onset  ? Hypertension Mother   ? Diabetes Mother   ? Heart attack Mother 52  ? COPD Maternal Grandmother   ? Emphysema Maternal Grandmother   ? Diabetes Paternal Grandfather   ? Breast cancer Paternal Aunt 42  ?     has contact  ? Breast cancer Paternal Aunt 53  ?     has contact  ? ? ?Social History  ? ?Socioeconomic History  ? Marital status: Single  ?  Spouse name: Not on file  ? Number of children: 3  ? Years of education: Not on file  ? Highest education level: Some college, no degree  ?Occupational History  ? Not on file  ?Tobacco Use  ? Smoking status: Never  ? Smokeless tobacco: Never  ?Vaping Use  ? Vaping Use: Never used  ?Substance and Sexual Activity  ? Alcohol use: Yes  ?  Alcohol/week: 1.0 standard drink  ?  Types: 1 Shots of liquor per week  ? Drug use: No  ? Sexual activity: Not Currently  ?  Birth control/protection: I.U.D.  ?  Comment: Mirena  ?Other Topics Concern  ? Not on file  ?Social History  Narrative  ? Has a 31 year old boy, 31 year old boy and daughter 73 years old.  ? Single mother  ? ?Social Determinants of Health  ? ?Financial Resource Strain: Low Risk   ? Difficulty of Paying Living Expenses: Not hard at all  ?Food Insecurity: No Food Insecurity  ? Worried About Charity fundraiser in the Last Year: Never true  ? Ran Out of Food in the Last Year: Never true  ?Transportation Needs: No Transportation Needs  ? Lack of Transportation (Medical): No  ? Lack of Transportation (Non-Medical): No  ?Physical Activity: Inactive  ? Days of Exercise per Week: 0 days  ? Minutes of Exercise per Session: 0 min  ?Stress: Stress Concern Present  ? Feeling of Stress : Rather much  ?Social Connections: Socially Isolated  ? Frequency of Communication with Friends and Family: Three times a week  ? Frequency of Social Gatherings with Friends and Family: Three times a week  ? Attends Religious Services: Never  ? Active Member of Clubs or Organizations: No  ? Attends Archivist Meetings: Never  ? Marital Status: Never married  ?  Intimate Partner Violence: Not At Risk  ? Fear of Current or Ex-Partner: No  ? Emotionally Abused: No  ? Physically Abused: No  ? Sexually Abused: No  ? ? ?Outpatient Medications Prior to Visit  ?Medication Sig Dispense Refill  ? levonorgestrel (MIRENA) 20 MCG/24HR IUD 1 Intra Uterine Device (1 each total) by Intrauterine route once for 1 dose. 1 each 0  ? ?No facility-administered medications prior to visit.  ? ? ?No Known Allergies ? ?Review of Systems  ?Constitutional:  Negative for chills and fever.  ?Respiratory:  Negative for cough and shortness of breath.   ?Skin:  Positive for rash.  ? ?   ?Objective:  ?  ?Physical Exam ?Constitutional:   ?   Appearance: Normal appearance.  ?HENT:  ?   Head: Normocephalic and atraumatic.  ?Eyes:  ?   Conjunctiva/sclera: Conjunctivae normal.  ?Cardiovascular:  ?   Rate and Rhythm: Normal rate and regular rhythm.  ?Pulmonary:  ?   Effort: Pulmonary  effort is normal.  ?   Breath sounds: Normal breath sounds.  ?Skin: ?   General: Skin is warm and dry.  ?   Findings: Rash present.  ?   Comments: Raised rash under tattoo, hard to determine erythema or not with overlaying pigment, has had some swelling previously but not noted today  ?Neurological:  ?   General: No focal deficit present.  ?   Mental Status: She is alert. Mental status is at baseline.  ?Psychiatric:     ?   Mood and Affect: Mood normal.     ?   Behavior: Behavior normal.  ? ? ?BP 102/60   Pulse 99   Temp 98.4 ?F (36.9 ?C) (Oral)   Resp 16   Ht 5' (1.524 m)   Wt 129 lb 6.4 oz (58.7 kg)   SpO2 96%   BMI 25.27 kg/m?  ?Wt Readings from Last 3 Encounters:  ?10/18/21 133 lb 8 oz (60.6 kg)  ?10/13/21 134 lb 6.4 oz (61 kg)  ?10/11/21 134 lb 1.6 oz (60.8 kg)  ? ? ?Health Maintenance Due  ?Topic Date Due  ? COVID-19 Vaccine (1) Never done  ? ? ?There are no preventive care reminders to display for this patient. ? ? ?Lab Results  ?Component Value Date  ? TSH 1.18 07/14/2021  ? ?Lab Results  ?Component Value Date  ? WBC 7.9 10/11/2021  ? HGB 12.3 10/11/2021  ? HCT 37.6 10/11/2021  ? MCV 85.1 10/11/2021  ? PLT 393 10/11/2021  ? ?Lab Results  ?Component Value Date  ? NA 137 10/11/2021  ? K 4.4 10/11/2021  ? CO2 25 10/11/2021  ? GLUCOSE 90 10/11/2021  ? BUN 21 10/11/2021  ? CREATININE 0.68 10/11/2021  ? BILITOT 0.5 10/11/2021  ? AST 18 10/11/2021  ? ALT 11 10/11/2021  ? PROT 7.2 10/11/2021  ? CALCIUM 9.8 10/11/2021  ? EGFR 120 10/11/2021  ? ?Lab Results  ?Component Value Date  ? CHOL 146 10/11/2021  ? ?Lab Results  ?Component Value Date  ? HDL 48 (L) 10/11/2021  ? ?Lab Results  ?Component Value Date  ? [place] 82 10/11/2021  ? ?Lab Results  ?Component Value Date  ? TRIG 84 10/11/2021  ? ?Lab Results  ?Component Value Date  ? CHOLHDL 3.0 10/11/2021  ? ?No results found for: HGBA1C ? ?   ?Assessment & Plan:  ? ?1. Rash due to allergy: This may be a manifestation of an allergy to the pigment as she's never  used this colors before. Will start anti-histamine and treat with steroid pack to reduced itching and swelling. Continue keeping the area moisturized and can use Benadryl cream as needed. If symptoms do not improve consider adding H2 blocker, Singulair, pursuing allergy testing.  ? ?- methylPREDNISolone (MEDROL DOSEPAK) 4 MG TBPK tablet; Day 1: Take 8 mg (2 tablets) before breakfast, 4 mg (1 tablet) after lunch, 4 mg (1 tablet) after supper, and 8 mg (2 tablets) at bedtime. Day 2:Take 4 mg (1 tablet) before breakfast, 4 mg (1 tablet) after lunch, 4 mg (1 tablet) after supper, and 8 mg (2 tablets) at bedtime. Day 3: Take 4 mg (1 tablet) before breakfast, 4 mg (1 tablet) after lunch, 4 mg (1 tablet) after supper, and 4 mg (1 tablet) at bedtime. Day 4: Take 4 mg (1 tablet) before breakfast, 4 mg (1 tablet) after lunch, and 4 mg (1 tablet) at bedtime. Day 5: Take 4 mg (1 tablet) before breakfast and 4 mg (1 tablet) at bedtime. Day 6: Take 4 mg (1 tablet) before breakfast.  Dispense: 1 each; Refill: 0 ? ? ?[name], DO ? ?

## 2021-11-15 ENCOUNTER — Ambulatory Visit: Payer: BC Managed Care – PPO | Admitting: Internal Medicine

## 2021-11-15 NOTE — Progress Notes (Deleted)
? ?Established Patient Office Visit ? ?Subjective:  ?Patient ID: Sabrina Dyer, female    DOB: 05-Nov-1990  Age: 31 y.o. MRN: [id number] ? ?CC: No chief complaint on file. ? ? ?HPI ?Sabrina Dyer presents for follow up on abdominal pain.  ? ?Gallbladder out 5 years ago, pain since then. Tries to eat healthy, skips breakfast. Avoids dairy and fried foods. States this pain is different from acid reflux she's had in the past, had been on a PPI in the past.  ?  ?Abdominal Complaint: ?-Duration:  5 years  ?-Frequency: intermittent ?-Nature: bloating, cramping, and ill-defined ?-Location: diffuse, RUQ, and epigastric  ?-Severity: moderate  ?-Radiation: no ?-Alleviating factors: Nothing  ?-Aggravating factors: Eating, particularly fried foods but can be eating in general ?-Treatments attempted: none ?-Constipation: no ?-Diarrhea:  looser stools lately  ?-Episodes of diarrhea/day: 1-2 loose stools a day ?-Mucous in the stool: no ?-Heartburn: no ?-Bloating:yes ?-Nausea: yes ?-Vomiting: no ?-Melena or hematochezia: no ?-Fever: no ?-Weight loss: no ?-Change in Appetite: yes ? ? ?An abdominal US was done 3/23 which was negative for acute findings other than a subcentimeter right renal inferior pole cyst. Labs done in February unremarkable.  ? ? ?Past Medical History:  ?Diagnosis Date  ? Anemia   ? Family history of breast cancer   ? 3/23 cancer genetic testing letter sent  ? Medical history non-contributory   ? ? ?Past Surgical History:  ?Procedure Laterality Date  ? CHOLECYSTECTOMY    ? ? ?Family History  ?Problem Relation Age of Onset  ? Hypertension Mother   ? Diabetes Mother   ? Heart attack Mother 38  ? COPD Maternal Grandmother   ? Emphysema Maternal Grandmother   ? Diabetes Paternal Grandfather   ? Breast cancer Paternal Aunt 97  ?     has contact  ? Breast cancer Paternal Aunt 47  ?     has contact  ? ? ?Social History  ? ?Socioeconomic History  ? Marital status: Single  ?  Spouse name: Not on file  ? Number of children: 3   ? Years of education: Not on file  ? Highest education level: Some college, no degree  ?Occupational History  ? Not on file  ?Tobacco Use  ? Smoking status: Never  ? Smokeless tobacco: Never  ?Vaping Use  ? Vaping Use: Never used  ?Substance and Sexual Activity  ? Alcohol use: Yes  ?  Alcohol/week: 1.0 standard drink  ?  Types: 1 Shots of liquor per week  ? Drug use: No  ? Sexual activity: Not Currently  ?  Birth control/protection: I.U.D.  ?  Comment: Mirena  ?Other Topics Concern  ? Not on file  ?Social History Narrative  ? Has a 31 year old boy, 31 year old boy and daughter 62 years old.  ? Single mother  ? ?Social Determinants of Health  ? ?Financial Resource Strain: Low Risk   ? Difficulty of Paying Living Expenses: Not hard at all  ?Food Insecurity: No Food Insecurity  ? Worried About Charity fundraiser in the Last Year: Never true  ? Ran Out of Food in the Last Year: Never true  ?Transportation Needs: No Transportation Needs  ? Lack of Transportation (Medical): No  ? Lack of Transportation (Non-Medical): No  ?Physical Activity: Inactive  ? Days of Exercise per Week: 0 days  ? Minutes of Exercise per Session: 0 min  ?Stress: Stress Concern Present  ? Feeling of Stress : Rather much  ?Social Connections:  Socially Isolated  ? Frequency of Communication with Friends and Family: Three times a week  ? Frequency of Social Gatherings with Friends and Family: Three times a week  ? Attends Religious Services: Never  ? Active Member of Clubs or Organizations: No  ? Attends Archivist Meetings: Never  ? Marital Status: Never married  ?Intimate Partner Violence: Not At Risk  ? Fear of Current or Ex-Partner: No  ? Emotionally Abused: No  ? Physically Abused: No  ? Sexually Abused: No  ? ? ?Outpatient Medications Prior to Visit  ?Medication Sig Dispense Refill  ? levonorgestrel (MIRENA) 20 MCG/24HR IUD 1 Intra Uterine Device (1 each total) by Intrauterine route once for 1 dose. 1 each 0  ? methylPREDNISolone  (MEDROL DOSEPAK) 4 MG TBPK tablet Day 1: Take 8 mg (2 tablets) before breakfast, 4 mg (1 tablet) after lunch, 4 mg (1 tablet) after supper, and 8 mg (2 tablets) at bedtime. Day 2:Take 4 mg (1 tablet) before breakfast, 4 mg (1 tablet) after lunch, 4 mg (1 tablet) after supper, and 8 mg (2 tablets) at bedtime. Day 3: Take 4 mg (1 tablet) before breakfast, 4 mg (1 tablet) after lunch, 4 mg (1 tablet) after supper, and 4 mg (1 tablet) at bedtime. Day 4: Take 4 mg (1 tablet) before breakfast, 4 mg (1 tablet) after lunch, and 4 mg (1 tablet) at bedtime. Day 5: Take 4 mg (1 tablet) before breakfast and 4 mg (1 tablet) at bedtime. Day 6: Take 4 mg (1 tablet) before breakfast. 1 each 0  ? ?No facility-administered medications prior to visit.  ? ? ?No Known Allergies ? ?ROS ?Review of Systems ? ?  ?Objective:  ?  ?Physical Exam ? ?There were no vitals taken for this visit. ?Wt Readings from Last 3 Encounters:  ?11/10/21 129 lb 6.4 oz (58.7 kg)  ?10/18/21 133 lb 8 oz (60.6 kg)  ?10/13/21 134 lb 6.4 oz (61 kg)  ? ? ? ?There are no preventive care reminders to display for this patient. ? ?There are no preventive care reminders to display for this patient. ? ?Lab Results  ?Component Value Date  ? TSH 1.18 07/14/2021  ? ?Lab Results  ?Component Value Date  ? WBC 7.9 10/11/2021  ? HGB 12.3 10/11/2021  ? HCT 37.6 10/11/2021  ? MCV 85.1 10/11/2021  ? PLT 393 10/11/2021  ? ?Lab Results  ?Component Value Date  ? NA 137 10/11/2021  ? K 4.4 10/11/2021  ? CO2 25 10/11/2021  ? GLUCOSE 90 10/11/2021  ? BUN 21 10/11/2021  ? CREATININE 0.68 10/11/2021  ? BILITOT 0.5 10/11/2021  ? AST 18 10/11/2021  ? ALT 11 10/11/2021  ? PROT 7.2 10/11/2021  ? CALCIUM 9.8 10/11/2021  ? EGFR 120 10/11/2021  ? ?Lab Results  ?Component Value Date  ? CHOL 146 10/11/2021  ? ?Lab Results  ?Component Value Date  ? HDL 48 (L) 10/11/2021  ? ?Lab Results  ?Component Value Date  ? Colbert 82 10/11/2021  ? ?Lab Results  ?Component Value Date  ? TRIG 84 10/11/2021  ? ?Lab  Results  ?Component Value Date  ? CHOLHDL 3.0 10/11/2021  ? ?No results found for: HGBA1C ? ?  ?Assessment & Plan:  ? ?Problem List Items Addressed This Visit   ?None ? ? ?No orders of the defined types were placed in this encounter. ? ? ?Follow-up: No follow-ups on file.  ? ? ?[name], DO ?

## 2022-01-21 ENCOUNTER — Other Ambulatory Visit (HOSPITAL_COMMUNITY)
Admission: RE | Admit: 2022-01-21 | Discharge: 2022-01-21 | Disposition: A | Discharge status: Home / Self Care | Payer: BC Managed Care – PPO | Source: Ambulatory Visit | Attending: Family Medicine | Admitting: Family Medicine

## 2022-01-21 ENCOUNTER — Encounter: Payer: Self-pay | Admitting: Internal Medicine

## 2022-01-21 ENCOUNTER — Ambulatory Visit (INDEPENDENT_AMBULATORY_CARE_PROVIDER_SITE_OTHER): Payer: BC Managed Care – PPO | Admitting: Internal Medicine

## 2022-01-21 VITALS — BP 110/70 | HR 87 | Resp 16 | Ht 60.0 in | Wt 131.0 lb

## 2022-01-21 DIAGNOSIS — B3731 Acute candidiasis of vulva and vagina: Secondary | ICD-10-CM | POA: Insufficient documentation

## 2022-01-21 DIAGNOSIS — N898 Other specified noninflammatory disorders of vagina: Secondary | ICD-10-CM | POA: Insufficient documentation

## 2022-01-21 DIAGNOSIS — R300 Dysuria: Secondary | ICD-10-CM | POA: Diagnosis not present

## 2022-01-21 DIAGNOSIS — B379 Candidiasis, unspecified: Secondary | ICD-10-CM

## 2022-01-21 LAB — POCT URINALYSIS DIPSTICK
Bilirubin, UA: NEGATIVE
Blood, UA: NEGATIVE
Glucose, UA: NEGATIVE
Ketones, UA: NEGATIVE
Leukocytes, UA: NEGATIVE
Nitrite, UA: NEGATIVE
Protein, UA: NEGATIVE
Spec Grav, UA: 1.02 (ref 1.010–1.025)
Urobilinogen, UA: 0.2 E.U./dL
pH, UA: 6.5 (ref 5.0–8.0)

## 2022-01-21 NOTE — Progress Notes (Signed)
   Acute Office Visit  Subjective:     Patient ID: Sabrina Dyer, female    DOB: Dec 01, 1990, 31 y.o.   MRN: [id number]  Chief Complaint  Patient presents with   Vaginitis    HPI Patient is in today for vaginal irritation. Feels like she's having burning and itching both on the inside and outside of vagina.   VAGINAL IRRITATION: Duration:  1 week Discharge description: no changes  Pruritus: yes Dysuria: no Malodorous: no Urinary frequency: yes Fevers: no Abdominal pain: no  Sexual activity: monogamous Recent antibiotic use: no Treatments attempted: none   Review of Systems  Constitutional:  Negative for chills and fever.  Genitourinary:  Positive for dysuria and urgency. Negative for hematuria.  Skin:  Positive for itching.       Objective:    BP 110/70   Pulse 87   Resp 16   Ht 5' (1.524 m)   Wt 131 lb (59.4 kg)   SpO2 98%   BMI 25.58 kg/m  BP Readings from Last 3 Encounters:  01/21/22 110/70  11/10/21 102/60  10/18/21 110/70   Wt Readings from Last 3 Encounters:  01/21/22 131 lb (59.4 kg)  11/10/21 129 lb 6.4 oz (58.7 kg)  10/18/21 133 lb 8 oz (60.6 kg)      Physical Exam Constitutional:      Appearance: Normal appearance.  HENT:     Head: Normocephalic and atraumatic.  Eyes:     Conjunctiva/sclera: Conjunctivae normal.  Cardiovascular:     Rate and Rhythm: Normal rate and regular rhythm.  Pulmonary:     Effort: Pulmonary effort is normal.     Breath sounds: Normal breath sounds.  Genitourinary:    Comments: External genitalia within normal limits.  Mild vulvar irritation but no lesions present. Vaginal mucosa pink, moist, normal rugae. No abnormal vaginal discharge noted.  Skin:    General: Skin is warm and dry.  Neurological:     General: No focal deficit present.     Mental Status: She is alert. Mental status is at baseline.  Psychiatric:        Mood and Affect: Mood normal.        Behavior: Behavior normal.     No results found  for any visits on 01/21/22.      Assessment & Plan:   1. Vaginal irritation: Exam normal, will swab for yeast, trich and BV.   - Cervicovaginal ancillary only  2. Dysuria: Also having some burning with urination, UA in the office negative for signs of infection.   - POCT Urinalysis Dipstick   Return if symptoms worsen or fail to improve.  [name], DO

## 2022-01-25 LAB — CERVICOVAGINAL ANCILLARY ONLY
Bacterial Vaginitis (gardnerella): NEGATIVE
Candida Glabrata: NEGATIVE
Candida Vaginitis: POSITIVE — AB
Chlamydia: NEGATIVE
Comment: NEGATIVE
Comment: NEGATIVE
Comment: NEGATIVE
Comment: NEGATIVE
Comment: NEGATIVE
Comment: NORMAL
Neisseria Gonorrhea: NEGATIVE
Trichomonas: NEGATIVE

## 2022-01-27 MED ORDER — FLUCONAZOLE 150 MG PO TABS: 150.0000 mg | ORAL_TABLET | Freq: Once | ORAL | 0 refills | Status: AC

## 2022-01-27 NOTE — Addendum Note (Signed)
Addended by: Margarita Mail on: 01/27/2022 08:49 AM   Modules accepted: Orders

## 2022-01-28 ENCOUNTER — Ambulatory Visit: Payer: BC Managed Care – PPO | Admitting: Family Medicine

## 2022-03-02 ENCOUNTER — Ambulatory Visit: Payer: Self-pay | Admitting: *Deleted

## 2022-03-02 NOTE — Telephone Encounter (Signed)
  Chief Complaint: knee injury one year ago Symptoms: suddenly started hurting this week. Frequency: most of time Pertinent Negatives: Patient denies fever or redness or injury Disposition: [] ED /[] Urgent Care (no appt availability in office) / [x] Appointment(In office/virtual)/ []  [facility]/ [] Home Care/ [] Refused Recommended Disposition /[] [facility]/ []  Follow-up with PCP Additional Notes: Appt for next week, care tips given.  Reason for Disposition  [1] MODERATE pain (e.g., interferes with normal activities, limping) AND [2] present > 3 days  Answer Assessment - Initial Assessment Questions 1. LOCATION and RADIATION: "Where is the pain located?"      Left knee, fell a year ago and hurt it 2. QUALITY: "What does the pain feel like?"  (e.g., sharp, dull, aching, burning)     Pain and numbness 3. SEVERITY: "How bad is the pain?" "What does it keep you from doing?"   (Scale 1-10; or mild, moderate, severe)   -  MILD (1-3): doesn't interfere with normal activities    -  MODERATE (4-7): interferes with normal activities (e.g., work or school) or awakens from sleep, limping    -  SEVERE (8-10): excruciating pain, unable to do any normal activities, unable to walk     7-8 at times 4. ONSET: "When did the pain start?" "Does it come and go, or is it there all the time?"     months 5. RECURRENT: "Have you had this pain before?" If Yes, ask: "When, and what happened then?"     months 6. SETTING: "Has there been any recent work, exercise or other activity that involved that part of the body?"      a year ago 7. AGGRAVATING FACTORS: "What makes the knee pain worse?" (e.g., walking, climbing stairs, running)     Walking and standing and stairs 8. ASSOCIATED SYMPTOMS: "Is there any swelling or redness of the knee?"     no 9. OTHER SYMPTOMS: "Do you have any other symptoms?" (e.g., chest pain, difficulty breathing, fever, calf pain)    No calf pain but hurts behind  knee pain 10. PREGNANCY: "Is there any chance you are pregnant?" "When was your last menstrual period?"       na  Protocols used: Knee Pain-A-AH

## 2022-03-07 ENCOUNTER — Ambulatory Visit: Payer: BC Managed Care – PPO | Admitting: Family Medicine

## 2022-03-09 NOTE — Progress Notes (Unsigned)
   There were no vitals taken for this visit.   Subjective:    Patient ID: Sabrina Dyer, female    DOB: 1990-08-25, 31 y.o.   MRN: [id number]  HPI: Sabrina Dyer is a 31 y.o. female  No chief complaint on file.  Knee pain:  Relevant past medical, surgical, family and social history reviewed and updated as indicated. Interim medical history since our last visit reviewed. Allergies and medications reviewed and updated.  Review of Systems  Per HPI unless specifically indicated above     Objective:    There were no vitals taken for this visit.  Wt Readings from Last 3 Encounters:  01/21/22 131 lb (59.4 kg)  11/10/21 129 lb 6.4 oz (58.7 kg)  10/18/21 133 lb 8 oz (60.6 kg)    Physical Exam  Results for orders placed or performed in visit on 01/21/22  POCT Urinalysis Dipstick  Result Value Ref Range   Color, UA Yellow    Clarity, UA Clear    Glucose, UA Negative Negative   Bilirubin, UA Negative    Ketones, UA Negative    Spec Grav, UA 1.020 1.010 - 1.025   Blood, UA Negative    pH, UA 6.5 5.0 - 8.0   Protein, UA Negative Negative   Urobilinogen, UA 0.2 0.2 or 1.0 E.U./dL   Nitrite, UA Negative    Leukocytes, UA Negative Negative   Appearance     Odor    Cervicovaginal ancillary only  Result Value Ref Range   Neisseria Gonorrhea Negative    Chlamydia Negative    Trichomonas Negative    Bacterial Vaginitis (gardnerella) Negative    Candida Vaginitis Positive (A)    Candida Glabrata Negative    Comment      Normal Reference Range Bacterial Vaginosis - Negative   Comment Normal Reference Range Candida Species - Negative    Comment Normal Reference Range Candida Galbrata - Negative    Comment Normal Reference Range Trichomonas - Negative    Comment Normal Reference Ranger Chlamydia - Negative    Comment      Normal Reference Range Neisseria Gonorrhea - Negative      Assessment & Plan:   Problem List Items Addressed This Visit   None    Follow up plan: No  follow-ups on file.

## 2022-03-10 ENCOUNTER — Encounter: Payer: Self-pay | Admitting: Nurse Practitioner

## 2022-03-10 ENCOUNTER — Ambulatory Visit (INDEPENDENT_AMBULATORY_CARE_PROVIDER_SITE_OTHER): Payer: BC Managed Care – PPO | Admitting: Nurse Practitioner

## 2022-03-10 ENCOUNTER — Other Ambulatory Visit: Payer: Self-pay

## 2022-03-10 VITALS — BP 110/68 | HR 78 | Temp 98.6°F | Resp 16 | Ht 60.0 in | Wt 133.4 lb

## 2022-03-10 DIAGNOSIS — M25562 Pain in left knee: Secondary | ICD-10-CM

## 2022-07-08 IMAGING — US US ABDOMEN COMPLETE
1 series · 14 of 25 positions shown · non-contrast
Comparison: None.

CLINICAL DATA: Right upper quadrant abdominal pain.

EXAM:
ABDOMEN ULTRASOUND COMPLETE

[Series 1: us abdomen complete · 0.22mm/px · 14 of 113 slices shown]
[im 1/113]
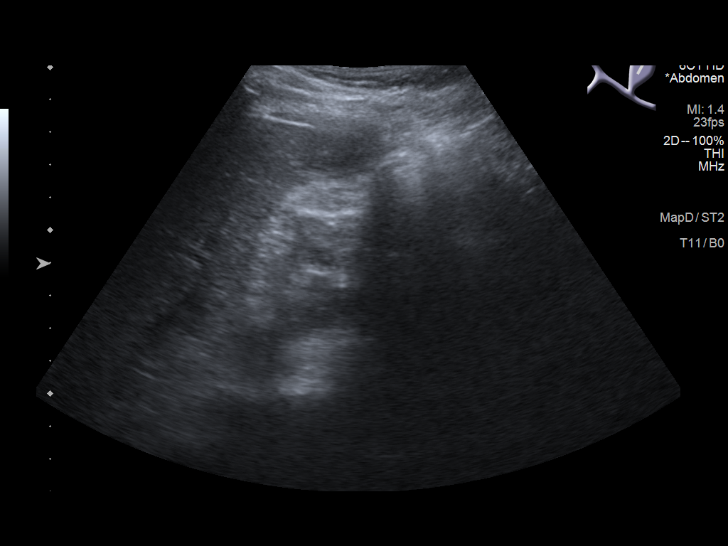
[im 10/113]
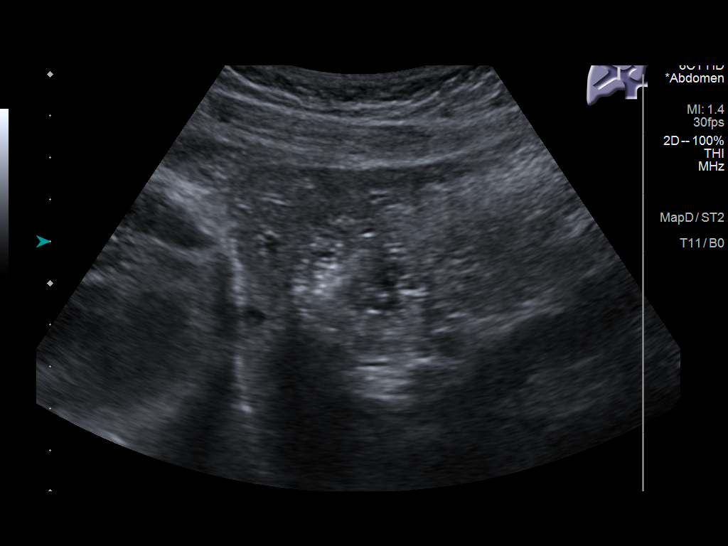
[im 19/113]
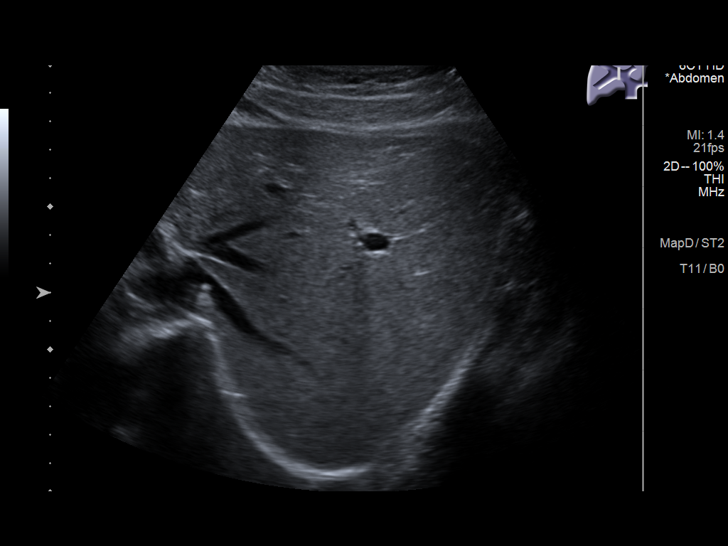
[im 29/113]
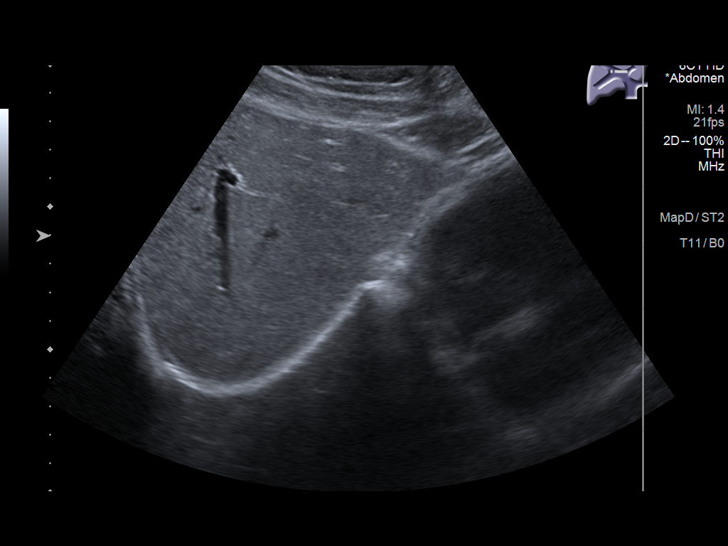
[im 38/113]
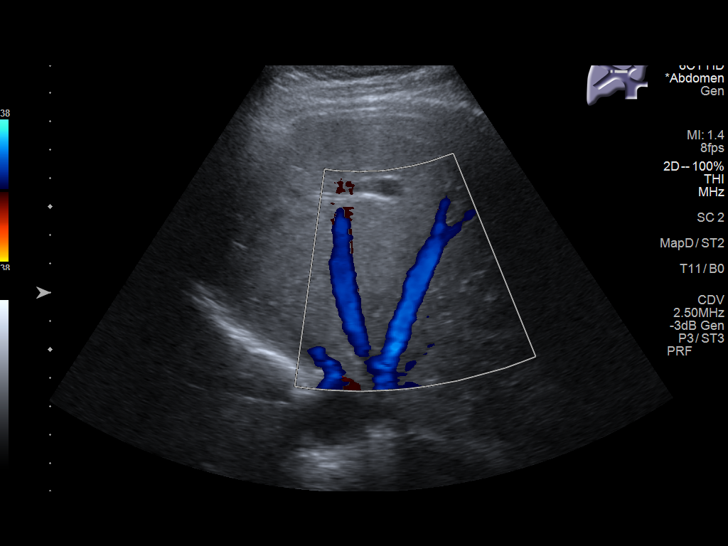
[im 43/113]
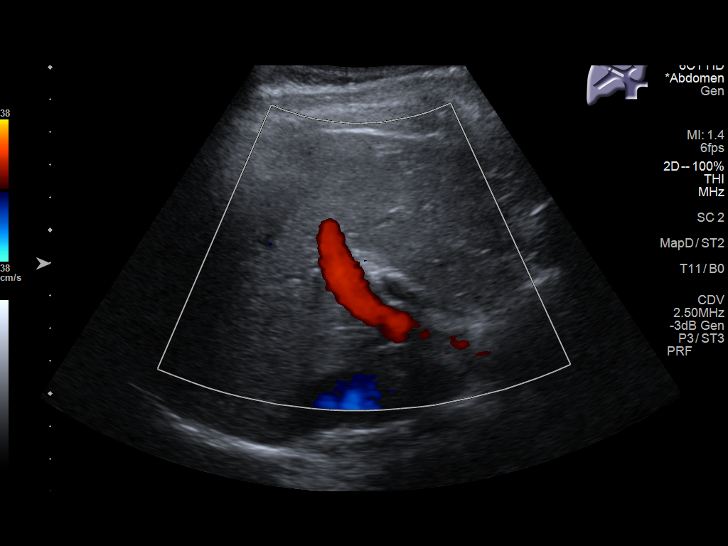
[im 52/113]
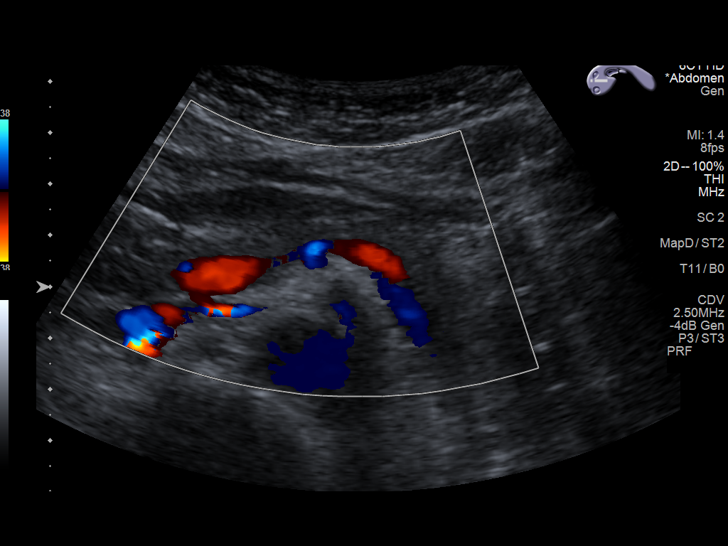
[im 61/113]
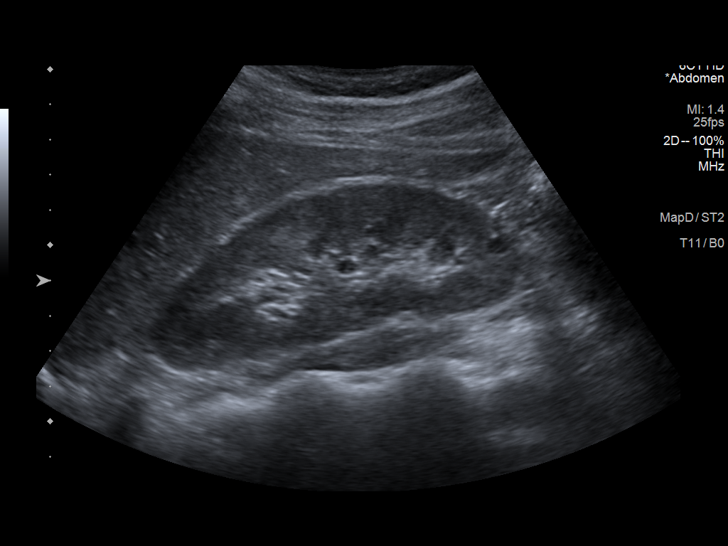
[im 71/113]
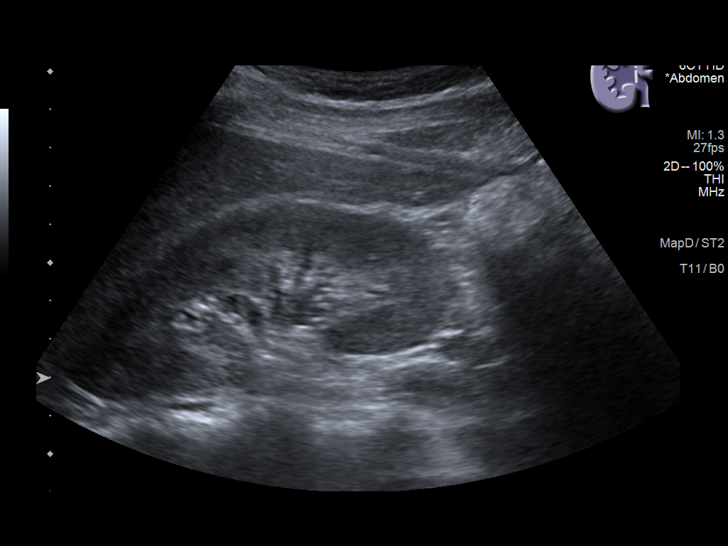
[im 75/113]
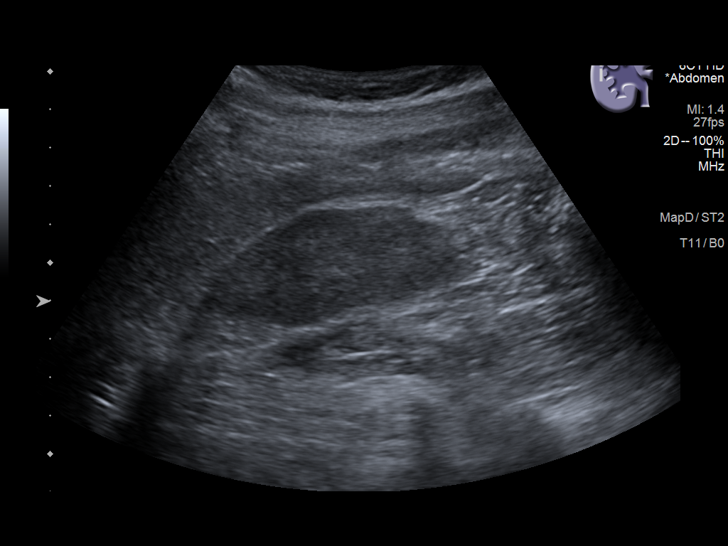
[im 85/113]
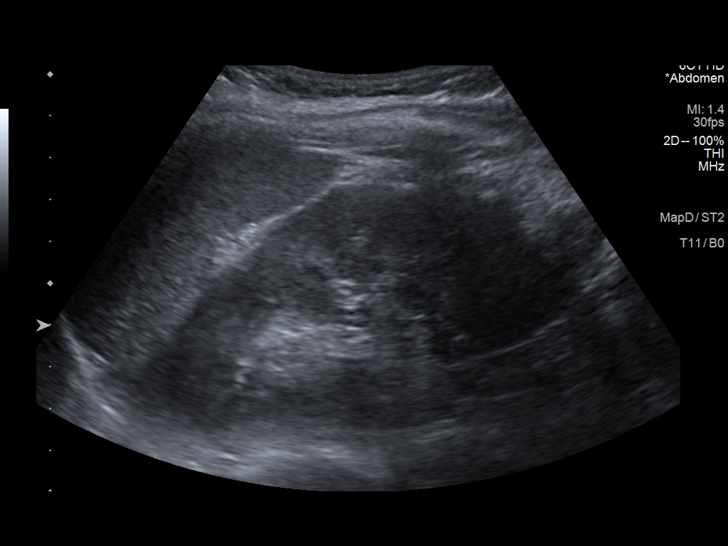
[im 94/113]
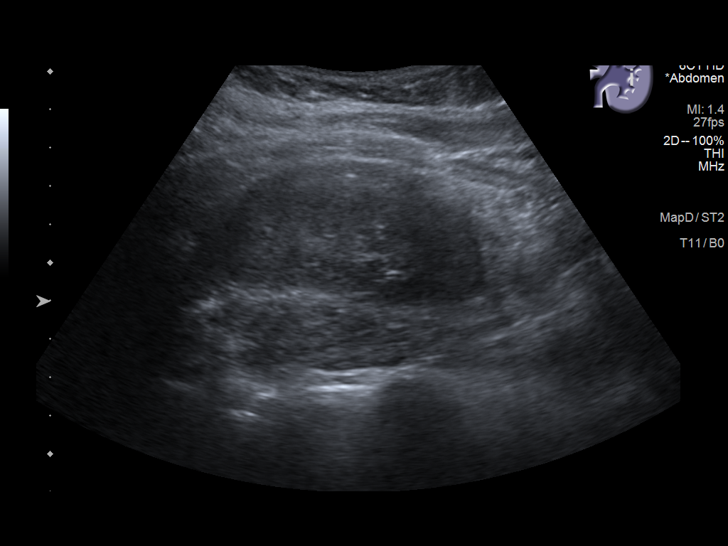
[im 103/113]
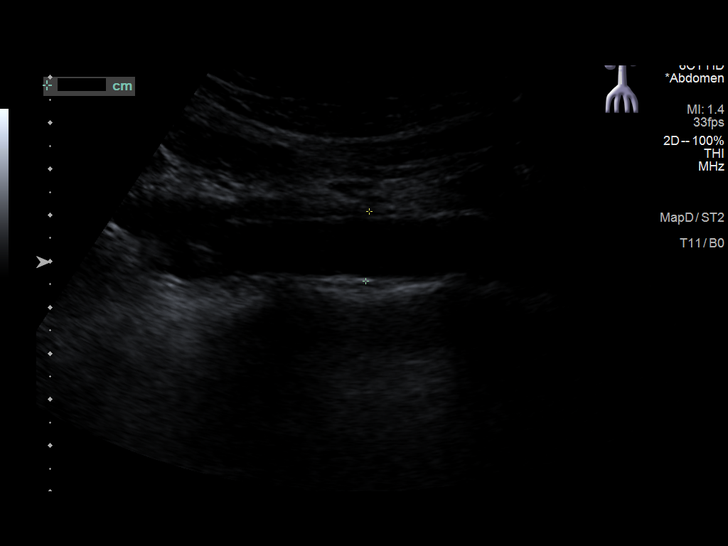
[im 113/113]
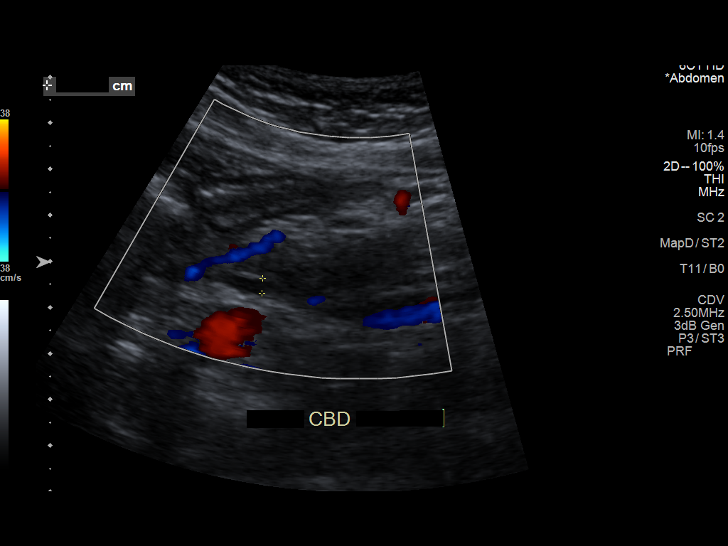

[14 of 25 positions shown; findings below may reference images not displayed]

FINDINGS: Gallbladder: Cholecystectomy.

Common bile duct: Diameter: 6 mm

Liver: No focal lesion identified. Within normal limits in
parenchymal echogenicity. Portal vein is patent on color Doppler
imaging with normal direction of blood flow towards the liver.

IVC: No abnormality visualized.

Pancreas: Visualized portion unremarkable.

Spleen: Size and appearance within normal limits.

Right Kidney: Length: 10.8 cm. Normal echogenicity. No
hydronephrosis or shadowing stone. A 5 mm inferior pole cyst.

Left Kidney: Length: 10.9 cm. Normal echogenicity. No hydronephrosis
or shadowing stone.

Abdominal aorta: No aneurysm visualized.

Other findings: None.
IMPRESSION: 1. No acute findings.
2. Cholecystectomy.
3. Subcentimeter right renal inferior pole cyst.

## 2022-09-07 NOTE — Progress Notes (Unsigned)
   Acute Office Visit  Subjective:     Patient ID: Sabrina Dyer, female    DOB: [birth year], 32 y.o.   MRN: [id number]  No chief complaint on file.   HPI Patient is in today for pelvic pain/urinary frequency.   URINARY SYMPTOMS  Dysuria: {Blank single:19197::"yes","no","burning"} Urinary frequency: {Blank single:19197::"yes","no"} Urgency: {Blank single:19197::"yes","no"} Small volume voids: {Blank single:19197::"yes","no"} Symptom severity: {Blank single:19197::"yes","no"} Urinary incontinence: {Blank single:19197::"yes","no"} Foul odor: {Blank single:19197::"yes","no"} Hematuria: {Blank single:19197::"yes","no"} Abdominal pain: {Blank single:19197::"yes","no"} Back pain: {Blank single:19197::"yes","no"} Suprapubic pain/pressure: {Blank single:19197::"yes","no"} Flank pain: {Blank single:19197::"yes","no"} Fever:  {Blank multiple:19196::"yes","no","subjective","low grade"} Vomiting: {Blank single:19197::"yes","no"} Relief with cranberry juice: {Blank single:19197::"yes","no"} Relief with pyridium: {Blank single:19197::"yes","no"} Status: better/worse/stable Previous urinary tract infection: {Blank single:19197::"yes","no"} Recurrent urinary tract infection: {Blank single:19197::"yes","no"} Sexual activity: No sexually active/monogomous/practicing safe sex History of sexually transmitted disease: {Blank single:19197::"yes","no"} Penile discharge: {Blank single:19197::"yes","no"} Treatments attempted: {Blank multiple:19196::"none","antibiotics","pyridium","cranberry","increasing fluids"}    ROS      Objective:    There were no vitals taken for this visit. {Vitals History (Optional):23777}  Physical Exam  No results found for any visits on 09/08/22.      Assessment & Plan:   Problem List Items Addressed This Visit   None   No orders of the defined types were placed in this encounter.   No follow-ups on file.  [name], DO

## 2022-09-08 ENCOUNTER — Other Ambulatory Visit: Payer: Self-pay | Admitting: Internal Medicine

## 2022-09-08 ENCOUNTER — Ambulatory Visit (INDEPENDENT_AMBULATORY_CARE_PROVIDER_SITE_OTHER): Payer: BC Managed Care – PPO | Admitting: Internal Medicine

## 2022-09-08 ENCOUNTER — Encounter: Payer: Self-pay | Admitting: Internal Medicine

## 2022-09-08 ENCOUNTER — Other Ambulatory Visit (HOSPITAL_COMMUNITY)
Admission: RE | Admit: 2022-09-08 | Discharge: 2022-09-08 | Disposition: A | Discharge status: Home / Self Care | Payer: BC Managed Care – PPO | Source: Ambulatory Visit | Attending: Internal Medicine | Admitting: Internal Medicine

## 2022-09-08 VITALS — BP 110/68 | HR 99 | Temp 98.1°F | Resp 16 | Ht 60.0 in | Wt 132.5 lb

## 2022-09-08 DIAGNOSIS — R102 Pelvic and perineal pain: Secondary | ICD-10-CM

## 2022-09-08 DIAGNOSIS — R350 Frequency of micturition: Secondary | ICD-10-CM

## 2022-09-08 LAB — POCT URINALYSIS DIPSTICK
Bilirubin, UA: NEGATIVE
Glucose, UA: NEGATIVE
Ketones, UA: NEGATIVE
Nitrite, UA: NEGATIVE
Protein, UA: NEGATIVE
Spec Grav, UA: 1.01 (ref 1.010–1.025)
Urobilinogen, UA: 0.2 E.U./dL
pH, UA: 6.5 (ref 5.0–8.0)

## 2022-09-09 LAB — URINE CULTURE
MICRO NUMBER:: 14474163
SPECIMEN QUALITY:: ADEQUATE

## 2022-09-12 LAB — CERVICOVAGINAL ANCILLARY ONLY
Bacterial Vaginitis (gardnerella): NEGATIVE
Candida Glabrata: NEGATIVE
Candida Vaginitis: NEGATIVE
Chlamydia: NEGATIVE
Comment: NEGATIVE
Comment: NEGATIVE
Comment: NEGATIVE
Comment: NEGATIVE
Comment: NEGATIVE
Comment: NORMAL
Neisseria Gonorrhea: NEGATIVE
Trichomonas: NEGATIVE

## 2022-09-13 ENCOUNTER — Other Ambulatory Visit: Payer: Self-pay | Admitting: Internal Medicine

## 2022-09-13 DIAGNOSIS — R3129 Other microscopic hematuria: Secondary | ICD-10-CM

## 2022-09-13 DIAGNOSIS — R1031 Right lower quadrant pain: Secondary | ICD-10-CM

## 2022-09-13 DIAGNOSIS — R102 Pelvic and perineal pain: Secondary | ICD-10-CM

## 2022-09-23 ENCOUNTER — Ambulatory Visit: Payer: BC Managed Care – PPO

## 2022-10-12 NOTE — Progress Notes (Unsigned)
Name: Sabrina Dyer   MRN: [id number]    DOB: [birth year]   Date:10/13/2022       Progress Note  Subjective  Chief Complaint  Chief Complaint  Patient presents with   Annual Exam    HPI  Patient presents for annual CPE.  Diet: diet could be better, tries to avoid fatty foods due to gallbladder. Drinks coffee with a lot of sugar Exercise: 3-4 x a week at fitness center, squats/  Last Eye Exam: [facility] UTD Last Dental Exam: UTD  [place] Office Visit from 03/10/2022 in [facility]  AUDIT-C Score 1      Depression: Phq 9 is  negative    10/13/2022    2:10 PM 09/08/2022    3:50 PM 03/10/2022    8:54 AM 01/21/2022    1:32 PM 11/10/2021    9:28 AM  Depression screen PHQ 2/9  Decreased Interest 1 0 1 2 0  Down, Depressed, Hopeless 0 0 1 2 0  PHQ - 2 Score 1 0 2 4 0  Altered sleeping 3 0 1 0 0  Tired, decreased energy 0 0 1 0 0  Change in appetite 0 0 0 0 0  Feeling bad or failure about yourself  0 0 0 0 0  Trouble concentrating 3 0 3 3 0  Moving slowly or fidgety/restless 0 0 0 0 0  Suicidal thoughts 0 0 0 0 0  PHQ-9 Score 7 0 7 7 0  Difficult doing work/chores  Not difficult at all Somewhat difficult  Not difficult at all   Hypertension: BP Readings from Last 3 Encounters:  10/13/22 116/68  09/08/22 110/68  03/10/22 110/68   Obesity: Wt Readings from Last 3 Encounters:  10/13/22 140 lb (63.5 kg)  09/08/22 132 lb 8 oz (60.1 kg)  03/10/22 133 lb 6.4 oz (60.5 kg)   BMI Readings from Last 3 Encounters:  10/13/22 27.34 kg/m  09/08/22 25.88 kg/m  03/10/22 26.05 kg/m     Vaccines:  HPV: UTD Tdap: 2015 UTD Shingrix: n/a Pneumonia: n/a Flu: Politely declines COVID-19: No   Hep C Screening: 2023 STD testing and prevention (HIV/chl/gon/syphilis): no concerns  Intimate partner violence: negative screen  Menstrual History/LMP/Abnormal Bleeding: irregular but has IUD Discussed importance of follow up if any post-menopausal  bleeding: not applicable  Incontinence Symptoms: negative for symptoms   Breast cancer:  - Last Mammogram: N/a  Osteoporosis Prevention : Discussed high calcium and vitamin D supplementation, weight bearing exercises Bone density :no   Cervical cancer screening: 3/23 positive HPV and LSIL Gynecology ??  Skin cancer: Discussed monitoring for atypical lesions  Colorectal cancer: Discussed, start screening at age 89   Lung cancer:  Low Dose CT Chest recommended if Age 104-80 years, 4 pack-year currently smoking OR have quit w/in 15years. Patient does not qualify for screen    Advanced Care Planning: A voluntary discussion about advance care planning including the explanation and discussion of advance directives.  Discussed health care proxy and Living will, and the patient was able to identify a health care proxy as sister Bhawna Burdine.  Patient does not have a living will and power of attorney of health care   Lipids: Lab Results  Component Value Date   CHOL 146 10/11/2021   CHOL 134 10/04/2019   Lab Results  Component Value Date   HDL 48 (L) 10/11/2021   HDL 48 (L) 10/04/2019   Lab Results  Component Value Date   LDLCALC  82 10/11/2021   LDLCALC 73 10/04/2019   Lab Results  Component Value Date   TRIG 84 10/11/2021   TRIG 45 10/04/2019   Lab Results  Component Value Date   CHOLHDL 3.0 10/11/2021   CHOLHDL 2.8 10/04/2019   No results found for: "LDLDIRECT"  Glucose: Glucose, Bld  Date Value Ref Range Status  10/11/2021 90 65 - 99 mg/dL Final    Comment:    .            Fasting reference interval .   05/05/2021 87 65 - 99 mg/dL Final    Comment:    .            Fasting reference interval .   10/09/2020 88 65 - 99 mg/dL Final    Comment:    .            Fasting reference interval .     There are no problems to display for this patient.   Past Surgical History:  Procedure Laterality Date   CHOLECYSTECTOMY      Family History  Problem Relation Age  of Onset   Hypertension Mother    Diabetes Mother    Heart attack Mother 30   COPD Maternal Grandmother    Emphysema Maternal Grandmother    Diabetes Paternal Grandfather    Breast cancer Paternal Aunt 23       has contact   Breast cancer Paternal Aunt 62       has contact    Social History   Socioeconomic History   Marital status: Single    Spouse name: Not on file   Number of children: 3   Years of education: Not on file   Highest education level: Some college, no degree  Occupational History   Not on file  Tobacco Use   Smoking status: Never   Smokeless tobacco: Never  Vaping Use   Vaping Use: Never used  Substance and Sexual Activity   Alcohol use: Yes    Alcohol/week: 1.0 standard drink of alcohol    Types: 1 Shots of liquor per week   Drug use: No   Sexual activity: Not Currently    Birth control/protection: I.U.D.    Comment: Mirena  Other Topics Concern   Not on file  Social History Narrative   Has a 32 year old boy, 32 year old boy and daughter 31 years old.   Single mother   Social Determinants of Health   Financial Resource Strain: High Risk (10/13/2022)   Overall Financial Resource Strain (CARDIA)    Difficulty of Paying Living Expenses: Hard  Food Insecurity: Food Insecurity Present (10/13/2022)   Hunger Vital Sign    Worried About Running Out of Food in the Last Year: Sometimes true    Ran Out of Food in the Last Year: Sometimes true  Transportation Needs: No Transportation Needs (10/13/2022)   PRAPARE - Hydrologist (Medical): No    Lack of Transportation (Non-Medical): No  Physical Activity: Insufficiently Active (10/13/2022)   Exercise Vital Sign    Days of Exercise per Week: 3 days    Minutes of Exercise per Session: 30 min  Stress: Stress Concern Present (10/13/2022)   [place]    Feeling of Stress : Rather much  Social Connections: Socially Isolated  (10/13/2022)   Social Connection and Isolation Panel [NHANES]    Frequency of Communication with Friends and Family: More than  three times a week    Frequency of Social Gatherings with Friends and Family: Once a week    Attends Religious Services: Never    Marine scientist or Organizations: No    Attends Archivist Meetings: Never    Marital Status: Never married  Intimate Partner Violence: Not At Risk (10/13/2022)   Humiliation, Afraid, Rape, and Kick questionnaire    Fear of Current or Ex-Partner: No    Emotionally Abused: No    Physically Abused: No    Sexually Abused: No     Current Outpatient Medications:    levonorgestrel (MIRENA) 20 MCG/24HR IUD, 1 Intra Uterine Device (1 each total) by Intrauterine route once for 1 dose., Disp: 1 each, Rfl: 0  No Known Allergies   Review of Systems  All other systems reviewed and are negative.   Objective  Vitals:   10/13/22 1411  BP: 116/68  Pulse: 95  Resp: 16  SpO2: 99%  Weight: 140 lb (63.5 kg)  Height: 5' (1.524 m)    Body mass index is 27.34 kg/m.  Physical Exam Constitutional:      Appearance: Normal appearance.  HENT:     Head: Normocephalic and atraumatic.     Mouth/Throat:     Mouth: Mucous membranes are moist.     Pharynx: Oropharynx is clear.  Eyes:     Conjunctiva/sclera: Conjunctivae normal.     Pupils: Pupils are equal, round, and reactive to light.  Neck:     Comments: No thyromegaly  Cardiovascular:     Rate and Rhythm: Normal rate and regular rhythm.  Pulmonary:     Effort: Pulmonary effort is normal.     Breath sounds: Normal breath sounds.  Musculoskeletal:     Cervical back: No tenderness.     Right lower leg: No edema.     Left lower leg: No edema.  Lymphadenopathy:     Cervical: No cervical adenopathy.  Skin:    General: Skin is warm and dry.  Neurological:     General: No focal deficit present.     Mental Status: She is alert. Mental status is at baseline.   Psychiatric:        Mood and Affect: Mood normal.        Behavior: Behavior normal.     Recent Results (from the past 2160 hour(s))  POCT Urinalysis Dipstick     Status: Abnormal   Collection Time: 09/08/22  3:58 PM  Result Value Ref Range   Color, UA Yellow    Clarity, UA Cloudy    Glucose, UA Negative Negative   Bilirubin, UA Negative    Ketones, UA Negative    Spec Grav, UA 1.010 1.010 - 1.025   Blood, UA Moderate    pH, UA 6.5 5.0 - 8.0   Protein, UA Negative Negative   Urobilinogen, UA 0.2 0.2 or 1.0 E.U./dL   Nitrite, UA Negative    Leukocytes, UA Moderate (2+) (A) Negative   Appearance Yellow    Odor none   Urine Culture     Status: None   Collection Time: 09/08/22  4:09 PM  Result Value Ref Range   MICRO NUMBER: [id number]    SPECIMEN QUALITY: Adequate    Sample Source URINE    STATUS: FINAL    Result:      Less than 10,000 CFU/mL of single Gram positive organism isolated. No further testing will be performed. If clinically indicated, recollection using a method to minimize contamination, with  prompt transfer to Urine Culture Transport Tube, is recommended.  Cervicovaginal ancillary only     Status: None   Collection Time: 09/08/22  4:11 PM  Result Value Ref Range   Neisseria Gonorrhea Negative    Chlamydia Negative    Trichomonas Negative    Bacterial Vaginitis (gardnerella) Negative    Candida Vaginitis Negative    Candida Glabrata Negative    Comment Normal Reference Range Candida Species - Negative    Comment Normal Reference Range Candida Galbrata - Negative    Comment Normal Reference Range Trichomonas - Negative    Comment Normal Reference Ranger Chlamydia - Negative    Comment      Normal Reference Range Neisseria Gonorrhea - Negative   Comment      Normal Reference Range Bacterial Vaginosis - Negative     Fall Risk:    10/13/2022    2:10 PM 09/08/2022    3:50 PM 03/10/2022    8:53 AM 01/21/2022    1:32 PM 11/10/2021    9:28 AM  [place] in the past year? 0 0 1 0 0  Number falls in past yr: 0 0 0 0 0  Injury with Fall? 0 0 1 0 0  Risk for fall due to : No Fall Risks No Fall Risks History of fall(s) No Fall Risks No Fall Risks  Follow up Falls prevention discussed Falls prevention discussed;Education provided;Falls evaluation completed Falls evaluation completed Falls prevention discussed Falls prevention discussed    Functional Status Survey: Is the patient deaf or have difficulty hearing?: No Does the patient have difficulty seeing, even when wearing glasses/contacts?: No Does the patient have difficulty concentrating, remembering, or making decisions?: Yes Does the patient have difficulty walking or climbing stairs?: No Does the patient have difficulty dressing or bathing?: No Does the patient have difficulty doing errands alone such as visiting a doctor's office or shopping?: No  Assessment & Plan  1. Well adult exam: Screening labs ordered. Discussed last year's Pap with the patient, she will call her Gynecologist and be scheduled for a repeat Pap this year.   - CBC w/Diff/Platelet - COMPLETE METABOLIC PANEL WITH GFR   -USPSTF grade A and B recommendations reviewed with patient; age-appropriate recommendations, preventive care, screening tests, etc discussed and encouraged; healthy living encouraged; see AVS for patient education given to patient -Discussed importance of 150 minutes of physical activity weekly, eat two servings of fish weekly, eat one serving of tree nuts ( cashews, pistachios, pecans, almonds.Marland Kitchen) every other day, eat 6 servings of fruit/vegetables daily and drink plenty of water and avoid sweet beverages.   -Reviewed Health Maintenance: Yes.

## 2022-10-13 ENCOUNTER — Encounter: Payer: Self-pay | Admitting: Internal Medicine

## 2022-10-13 ENCOUNTER — Ambulatory Visit (INDEPENDENT_AMBULATORY_CARE_PROVIDER_SITE_OTHER): Payer: BC Managed Care – PPO | Admitting: Internal Medicine

## 2022-10-13 VITALS — BP 116/68 | HR 95 | Resp 16 | Ht 60.0 in | Wt 140.0 lb

## 2022-10-13 DIAGNOSIS — Z0000 Encounter for general adult medical examination without abnormal findings: Secondary | ICD-10-CM | POA: Diagnosis not present

## 2022-10-13 NOTE — Addendum Note (Signed)
Addended by: [name] on: 10/13/2022 02:48 PM   Modules accepted: Orders

## 2022-10-13 NOTE — Patient Instructions (Addendum)
It was great seeing you today!  Plan discussed at today's visit: -Blood work ordered today, results will be uploaded to [place].  -Try melatonin for sleep over the counter -Call and make an appointment with Gynecologist   Follow up in: 1 year or sooner as needed  Take care and let us know if you have any questions or concerns prior to your next visit.  Dr. Rosana Berger  Insomnia Insomnia is a sleep disorder that makes it difficult to fall asleep or stay asleep. Insomnia can cause fatigue, low energy, difficulty concentrating, mood swings, and poor performance at work or school. There are three different ways to classify insomnia: Difficulty falling asleep. Difficulty staying asleep. Waking up too early in the morning. Any type of insomnia can be long-term (chronic) or short-term (acute). Both are common. Short-term insomnia usually lasts for 3 months or less. Chronic insomnia occurs at least three times a week for longer than 3 months. What are the causes? Insomnia may be caused by another condition, situation, or substance, such as: Having certain mental health conditions, such as anxiety and depression. Using caffeine, alcohol, tobacco, or drugs. Having gastrointestinal conditions, such as gastroesophageal reflux disease (GERD). Having certain medical conditions. These include: Asthma. Alzheimer's disease. Stroke. Chronic pain. An overactive thyroid gland (hyperthyroidism). Other sleep disorders, such as restless legs syndrome and sleep apnea. Menopause. Sometimes, the cause of insomnia may not be known. What increases the risk? Risk factors for insomnia include: Gender. Females are affected more often than males. Age. Insomnia is more common as people get older. Stress and certain medical and mental health conditions. Lack of exercise. Having an irregular work schedule. This may include working night shifts and traveling between different time zones. What are the signs or  symptoms? If you have insomnia, the main symptom is having trouble falling asleep or having trouble staying asleep. This may lead to other symptoms, such as: Feeling tired or having low energy. Feeling nervous about going to sleep. Not feeling rested in the morning. Having trouble concentrating. Feeling irritable, anxious, or depressed. How is this diagnosed? This condition may be diagnosed based on: Your symptoms and medical history. Your health care provider may ask about: Your sleep habits. Any medical conditions you have. Your mental health. A physical exam. How is this treated? Treatment for insomnia depends on the cause. Treatment may focus on treating an underlying condition that is causing the insomnia. Treatment may also include: Medicines to help you sleep. Counseling or therapy. Lifestyle adjustments to help you sleep better. Follow these instructions at home: Eating and drinking  Limit or avoid alcohol, caffeinated beverages, and products that contain nicotine and tobacco, especially close to bedtime. These can disrupt your sleep. Do not eat a large meal or eat spicy foods right before bedtime. This can lead to digestive discomfort that can make it hard for you to sleep. Sleep habits  Keep a sleep diary to help you and your health care provider figure out what could be causing your insomnia. Write down: When you sleep. When you wake up during the night. How well you sleep and how rested you feel the next day. Any side effects of medicines you are taking. What you eat and drink. Make your bedroom a dark, comfortable place where it is easy to fall asleep. Put up shades or blackout curtains to block light from outside. Use a white noise machine to block noise. Keep the temperature cool. Limit screen use before bedtime. This includes: Not watching TV. Not  using your smartphone, tablet, or computer. Stick to a routine that includes going to bed and waking up at the same  times every day and night. This can help you fall asleep faster. Consider making a quiet activity, such as reading, part of your nighttime routine. Try to avoid taking naps during the day so that you sleep better at night. Get out of bed if you are still awake after 15 minutes of trying to sleep. Keep the lights down, but try reading or doing a quiet activity. When you feel sleepy, go back to bed. General instructions Take over-the-counter and prescription medicines only as told by your health care provider. Exercise regularly as told by your health care provider. However, avoid exercising in the hours right before bedtime. Use relaxation techniques to manage stress. Ask your health care provider to suggest some techniques that may work well for you. These may include: Breathing exercises. Routines to release muscle tension. Visualizing peaceful scenes. Make sure that you drive carefully. Do not drive if you feel very sleepy. Keep all follow-up visits. This is important. Contact a health care provider if: You are tired throughout the day. You have trouble in your daily routine due to sleepiness. You continue to have sleep problems, or your sleep problems get worse. Get help right away if: You have thoughts about hurting yourself or someone else. Get help right away if you feel like you may hurt yourself or others, or have thoughts about taking your own life. Go to your nearest emergency room or: Call 911. Call the [place] at [phone] or 988. This is open 24 hours a day. Text the Crisis Text Line at 740-265-9662. Summary Insomnia is a sleep disorder that makes it difficult to fall asleep or stay asleep. Insomnia can be long-term (chronic) or short-term (acute). Treatment for insomnia depends on the cause. Treatment may focus on treating an underlying condition that is causing the insomnia. Keep a sleep diary to help you and your health care provider figure out  what could be causing your insomnia. This information is not intended to replace advice given to you by your health care provider. Make sure you discuss any questions you have with your health care provider. Document Revised: 07/12/2021 Document Reviewed: 07/12/2021 Elsevier Patient Education  [place].

## 2022-10-14 ENCOUNTER — Telehealth: Payer: Self-pay | Admitting: Internal Medicine

## 2022-10-14 DIAGNOSIS — Z419 Encounter for procedure for purposes other than remedying health state, unspecified: Secondary | ICD-10-CM | POA: Diagnosis not present

## 2022-10-14 LAB — CBC WITH DIFFERENTIAL/PLATELET
Absolute Monocytes: 725 cells/uL (ref 200–950)
Basophils Absolute: 75 cells/uL (ref 0–200)
Basophils Relative: 0.6 %
Eosinophils Absolute: 225 cells/uL (ref 15–500)
Eosinophils Relative: 1.8 %
HCT: 36.4 % (ref 35.0–45.0)
Hemoglobin: 12.2 g/dL (ref 11.7–15.5)
Lymphs Abs: 2213 cells/uL (ref 850–3900)
MCH: 28.4 pg (ref 27.0–33.0)
MCHC: 33.5 g/dL (ref 32.0–36.0)
MCV: 84.7 fL (ref 80.0–100.0)
MPV: 9.9 fL (ref 7.5–12.5)
Monocytes Relative: 5.8 %
Neutro Abs: 9263 cells/uL — ABNORMAL HIGH (ref 1500–7800)
Neutrophils Relative %: 74.1 %
Platelets: 381 10*3/uL (ref 140–400)
RBC: 4.3 10*6/uL (ref 3.80–5.10)
RDW: 12.7 % (ref 11.0–15.0)
Total Lymphocyte: 17.7 %
WBC: 12.5 10*3/uL — ABNORMAL HIGH (ref 3.8–10.8)

## 2022-10-14 LAB — COMPLETE METABOLIC PANEL WITH GFR
AG Ratio: 1.4 (calc) (ref 1.0–2.5)
ALT: 16 U/L (ref 6–29)
AST: 20 U/L (ref 10–30)
Albumin: 4.3 g/dL (ref 3.6–5.1)
Alkaline phosphatase (APISO): 65 U/L (ref 31–125)
BUN: 20 mg/dL (ref 7–25)
CO2: 26 mmol/L (ref 20–32)
Calcium: 9.1 mg/dL (ref 8.6–10.2)
Chloride: 105 mmol/L (ref 98–110)
Creat: 0.7 mg/dL (ref 0.50–0.97)
Globulin: 3 g/dL (calc) (ref 1.9–3.7)
Glucose, Bld: 146 mg/dL — ABNORMAL HIGH (ref 65–99)
Potassium: 3.9 mmol/L (ref 3.5–5.3)
Sodium: 142 mmol/L (ref 135–146)
Total Bilirubin: 0.5 mg/dL (ref 0.2–1.2)
Total Protein: 7.3 g/dL (ref 6.1–8.1)
eGFR: 119 mL/min/{1.73_m2} (ref >=60)

## 2022-10-14 NOTE — Telephone Encounter (Signed)
Copied from [place] [phone]. Topic: General - Inquiry >> Oct 14, 2022 12:22 PM Rosanne Ashing P wrote: Reason for CRM: Tashe with Precert dept at [facility].  Pt is having CT scn and her primary insurance has ok'ed but the 2nd [facility] Medicaid needs a PA before they can do the test .  test date is 3/5.    CB#  [phone]

## 2022-10-18 ENCOUNTER — Ambulatory Visit
Admission: RE | Admit: 2022-10-18 | Discharge: 2022-10-18 | Disposition: A | Discharge status: Home / Self Care | Payer: BC Managed Care – PPO | Source: Ambulatory Visit | Attending: Internal Medicine | Admitting: Internal Medicine

## 2022-10-18 DIAGNOSIS — R3129 Other microscopic hematuria: Secondary | ICD-10-CM | POA: Diagnosis present

## 2022-10-18 DIAGNOSIS — R102 Pelvic and perineal pain: Secondary | ICD-10-CM | POA: Diagnosis not present

## 2022-10-18 DIAGNOSIS — R1031 Right lower quadrant pain: Secondary | ICD-10-CM | POA: Diagnosis present

## 2022-10-25 ENCOUNTER — Other Ambulatory Visit: Payer: BC Managed Care – PPO

## 2022-10-25 NOTE — Patient Outreach (Signed)
  Medicaid Managed Care Social Work Note  10/25/2022 Name:  Sabrina Dyer MRN:  [id number] DOB:  1990/12/08  Sabrina Dyer is an 32 y.o. year old female who is a primary patient of [name], DO.  The Medicaid Managed Care Coordination team was consulted for assistance with:  Community Resources   [name] was given information about Medicaid Managed Care Coordination team services today. [name] Patient agreed to services and verbal consent obtained.  Engaged with patient  for by telephone forinitial visit in response to referral for case management and/or care coordination services.   Assessments/Interventions:  Review of past medical history, allergies, medications, health status, including review of consultants reports, laboratory and other test data, was performed as part of comprehensive evaluation and provision of chronic care management services.  SDOH: (Social Determinant of Health) assessments and interventions performed: SDOH Interventions    Flowsheet Row Office Visit from 12/29/2020 in [facility] Office Visit from 10/09/2020 in [facility]  SDOH Interventions    Depression Interventions/Treatment  Medication Referral to Psychiatry      BSW completed a telephone outreach with patient, she stated she really only needs resources for rent since it is just her. Patient states she and her children receive foodstamp and have transportation. BSW will send a list of resources to patient. No other resources are needed at this time. Advanced Directives Status:  Not addressed in this encounter.  Care Plan                 No Known Allergies  Medications Reviewed Today     Reviewed by [name], DO (Physician) on 10/13/22 at 1444  Med List Status: <None>   Medication Order Taking? Sig Documenting Provider Last Dose Status Informant  levonorgestrel (MIRENA) 20 MCG/24HR IUD [id number]  1 Intra Uterine Device (1  each total) by Intrauterine route once for 1 dose. Copland, Deirdre Evener, PA-C  Expired 07/23/19 2359             There are no problems to display for this patient.   Conditions to be addressed/monitored per PCP order:   community resources  There are no care plans that you recently modified to display for this patient.   Follow up:  Patient agrees to Care Plan and Follow-up.  Plan: The Managed Medicaid care management team will reach out to the patient again over the next 30 days.  Date/time of next scheduled Social Work care management/care coordination outreach:  10/29/22  Mickel Fuchs, Arita Miss, [place] Medicaid Team  [phone]

## 2022-10-25 NOTE — Patient Instructions (Signed)
Visit Information  [name] was given information about Medicaid Managed Care team care coordination services as a part of their [facility] Medicaid benefit. [name] verbally consented to engagement with the [facility] Managed Care team.   If you are experiencing a medical emergency, please call 911 or report to your local emergency department or urgent care.   If you have a non-emergency medical problem during routine business hours, please contact your provider's office and ask to speak with a nurse.   For questions related to your [facility] health plan, please call: [phone] or go here:[email or web address]  If you would like to schedule transportation through your [facility] plan, please call the following number at least 2 days in advance of your appointment: [phone].  You can also use the MTM portal or MTM mobile app to manage your rides. For the portal, please go to [email or web address].  Call the [place] at [phone], at any time, 24 hours a day, 7 days a week. If you are in danger or need immediate medical attention call 911.  If you would like help to quit smoking, call 1-800-QUIT-NOW (628)470-4249) OR Espaol: 1-855-Djelo-Ya HD:1601594) o para ms informacin haga clic aqu or Text READY to 200-400 to register via text  [name] - following are the goals we discussed in your visit today:   Goals Addressed   None      Social Worker will follow up on 11/29/22.   Mickel Fuchs, BSW, [place] Managed Medicaid Team  [phone]   Following is a copy of your plan of care:  There are no care plans that you recently modified to display for this patient.

## 2022-11-04 ENCOUNTER — Encounter: Payer: Self-pay | Admitting: Internal Medicine

## 2022-11-07 NOTE — Progress Notes (Unsigned)
   Established Patient Office Visit  Subjective   Patient ID: Darshae Munion, female    DOB: [birth year]  Age: 32 y.o. MRN: [id number]  No chief complaint on file.   HPI  Patient is here to discuss recent lab results, which showed a white count of 12.5 on 10/13/22 as well as a glucose of 146.   {History (Optional):23778}  ROS    Objective:     There were no vitals taken for this visit. {Vitals History (Optional):23777}  Physical Exam   No results found for any visits on 11/08/22.  {Labs (Optional):23779}  The ASCVD Risk score (Arnett DK, et al., 2019) failed to calculate for the following reasons:   The 2019 ASCVD risk score is only valid for ages 60 to 35    Assessment & Plan:   Problem List Items Addressed This Visit   None   No follow-ups on file.    [name], DO

## 2022-11-08 ENCOUNTER — Ambulatory Visit (INDEPENDENT_AMBULATORY_CARE_PROVIDER_SITE_OTHER): Payer: BC Managed Care – PPO | Admitting: Internal Medicine

## 2022-11-08 VITALS — BP 116/78 | HR 84 | Temp 97.6°F | Resp 18 | Ht 60.0 in | Wt 141.2 lb

## 2022-11-08 DIAGNOSIS — R7309 Other abnormal glucose: Secondary | ICD-10-CM | POA: Diagnosis not present

## 2022-11-08 DIAGNOSIS — D72829 Elevated white blood cell count, unspecified: Secondary | ICD-10-CM

## 2022-11-08 LAB — POCT URINALYSIS DIPSTICK
Bilirubin, UA: NEGATIVE
Blood, UA: NEGATIVE
Glucose, UA: NEGATIVE
Ketones, UA: NEGATIVE
Leukocytes, UA: NEGATIVE
Nitrite, UA: NEGATIVE
Odor: NORMAL
Protein, UA: NEGATIVE
Spec Grav, UA: 1.025 (ref 1.010–1.025)
Urobilinogen, UA: 0.2 E.U./dL
pH, UA: 5.5 (ref 5.0–8.0)

## 2022-11-09 LAB — CBC WITH DIFFERENTIAL/PLATELET
Absolute Monocytes: 468 cells/uL (ref 200–950)
Basophils Absolute: 79 cells/uL (ref 0–200)
Basophils Relative: 1.1 %
Eosinophils Absolute: 180 cells/uL (ref 15–500)
Eosinophils Relative: 2.5 %
HCT: 36.5 % (ref 35.0–45.0)
Hemoglobin: 12.2 g/dL (ref 11.7–15.5)
Lymphs Abs: 2383 cells/uL (ref 850–3900)
MCH: 28.2 pg (ref 27.0–33.0)
MCHC: 33.4 g/dL (ref 32.0–36.0)
MCV: 84.3 fL (ref 80.0–100.0)
MPV: 9.7 fL (ref 7.5–12.5)
Monocytes Relative: 6.5 %
Neutro Abs: 4090 cells/uL (ref 1500–7800)
Neutrophils Relative %: 56.8 %
Platelets: 384 10*3/uL (ref 140–400)
RBC: 4.33 10*6/uL (ref 3.80–5.10)
RDW: 12.7 % (ref 11.0–15.0)
Total Lymphocyte: 33.1 %
WBC: 7.2 10*3/uL (ref 3.8–10.8)

## 2022-11-09 LAB — HEMOGLOBIN A1C
Hgb A1c MFr Bld: 5.4 % of total Hgb (ref <5.7)
Mean Plasma Glucose: 108 mg/dL
eAG (mmol/L): 6 mmol/L

## 2022-11-14 DIAGNOSIS — Z419 Encounter for procedure for purposes other than remedying health state, unspecified: Secondary | ICD-10-CM | POA: Diagnosis not present

## 2022-11-29 ENCOUNTER — Other Ambulatory Visit: Payer: BC Managed Care – PPO

## 2022-11-29 NOTE — Patient Instructions (Signed)
Visit Information  Sabrina Dyer was given information about Medicaid Managed Care team care coordination services as a part of their [facility] Medicaid benefit. Sabrina Dyer verbally consented to engagement with the [facility] Managed Care team.   If you are experiencing a medical emergency, please call 911 or report to your local emergency department or urgent care.   If you have a non-emergency medical problem during routine business hours, please contact your provider's office and ask to speak with a nurse.   For questions related to your [facility] health plan, please call: [phone] or go here:[email or web address]  If you would like to schedule transportation through your [facility] plan, please call the following number at least 2 days in advance of your appointment: [phone].  You can also use the MTM portal or MTM mobile app to manage your rides. For the portal, please go to [email or web address]  Call the [facility] Crisis Line at [phone], at any time, 24 hours a day, 7 days a week. If you are in danger or need immediate medical attention call 911.  If you would like help to quit smoking, call 1-800-QUIT-NOW (253 337 5477) OR Espaol: 1-855-Djelo-Ya (4-132-440-1027) o para ms informacin haga clic aqu or Text READY to 253-664 to register via text  [name] - following are the goals we discussed in your visit today:   Goals Addressed   None      The  Patient                                              has been provided with contact information for the Managed Medicaid care management team and has been advised to call with any health related questions or concerns.   Sabrina Dyer, Sabrina Dyer, MHA [facility]  Managed Medicaid Social Worker [phone]   Following is a copy of your plan of care:  There are no care plans that you recently modified to display for this patient.

## 2022-11-29 NOTE — Patient Outreach (Signed)
  Medicaid Managed Care Social Work Note  11/29/2022 Name:  Allicia Culley MRN:  [id number] DOB:  Jun 12, 1991  Chiyeko Ferre is an 32 y.o. year old female who is a primary patient of Margarita Mail, DO.  The Medicaid Managed Care Coordination team was consulted for assistance with:  Community Resources   Ms. Tschirhart was given information about Medicaid Managed Care Coordination team services today. Wende Crease Patient agreed to services and verbal consent obtained.  Engaged with patient  for by telephone forfollow up visit in response to referral for case management and/or care coordination services.   Assessments/Interventions:  Review of past medical history, allergies, medications, health status, including review of consultants reports, laboratory and other test data, was performed as part of comprehensive evaluation and provision of chronic care management services.  SDOH: (Social Determinant of Health) assessments and interventions performed: SDOH Interventions    Flowsheet Row Office Visit from 12/29/2020 in [facility] Office Visit from 10/09/2020 in [facility]  SDOH Interventions    Depression Interventions/Treatment  Medication Referral to Psychiatry     BSW completed a telephone outreach with patient. She stated she did receieve the resources BSW sent to her and she did not need them as much as she thought she did. No other resources are needed at this time.  Advanced Directives Status:  Not addressed in this encounter.  Care Plan                 No Known Allergies  Medications Reviewed Today     Reviewed by Marcos Eke, CMA (Certified Medical Assistant) on 11/08/22 at 1045  Med List Status: <None>   Medication Order Taking? Sig Documenting Provider Last Dose Status Informant  levonorgestrel (MIRENA) 20 MCG/24HR IUD [id number]  1 Intra Uterine Device (1 each total) by Intrauterine route once for 1 dose. Copland, Ilona Sorrel,  PA-C  Expired 07/23/19 2359             There are no problems to display for this patient.   Conditions to be addressed/monitored per PCP order:   community resources  There are no care plans that you recently modified to display for this patient.   Follow up:  Patient agrees to Care Plan and Follow-up.  Plan: The  Patient has been provided with contact information for the Managed Medicaid care management team and has been advised to call with any health related questions or concerns.     Abelino Derrick, MHA [facility]  Managed [facility] Social Worker [phone]

## 2022-12-14 DIAGNOSIS — Z419 Encounter for procedure for purposes other than remedying health state, unspecified: Secondary | ICD-10-CM | POA: Diagnosis not present

## 2023-01-14 DIAGNOSIS — Z419 Encounter for procedure for purposes other than remedying health state, unspecified: Secondary | ICD-10-CM | POA: Diagnosis not present

## 2023-02-13 DIAGNOSIS — Z419 Encounter for procedure for purposes other than remedying health state, unspecified: Secondary | ICD-10-CM | POA: Diagnosis not present

## 2023-03-16 DIAGNOSIS — Z419 Encounter for procedure for purposes other than remedying health state, unspecified: Secondary | ICD-10-CM | POA: Diagnosis not present

## 2023-04-16 DIAGNOSIS — Z419 Encounter for procedure for purposes other than remedying health state, unspecified: Secondary | ICD-10-CM | POA: Diagnosis not present

## 2023-05-16 DIAGNOSIS — Z419 Encounter for procedure for purposes other than remedying health state, unspecified: Secondary | ICD-10-CM | POA: Diagnosis not present

## 2023-10-19 ENCOUNTER — Encounter: Payer: Self-pay | Admitting: Internal Medicine

## 2023-10-19 NOTE — Progress Notes (Signed)
 Name: Sabrina Dyer   MRN: [id number]    DOB: 1990-08-20   Date:10/20/2023       Progress Note  Subjective  Chief Complaint  Chief Complaint  Patient presents with   Annual Exam    HPI  Patient presents for annual CPE. Patient states she's been dealing with some fatigue and would like labs to look at potential causes.   Diet: Regular Exercise: 6 days 120 minutes  Last Eye Exam: completed Last Dental Exam: completed  Flowsheet Row Office Visit from 10/20/2023 in [facility]  AUDIT-C Score 0      Depression: Phq 9 is  negative    10/20/2023    9:08 AM 11/08/2022   10:45 AM 10/13/2022    2:10 PM 09/08/2022    3:50 PM 03/10/2022    8:54 AM  Depression screen PHQ 2/9  Decreased Interest 0 0 1 0 1  Down, Depressed, Hopeless 0 0 0 0 1  PHQ - 2 Score 0 0 1 0 2  Altered sleeping  3 3 0 1  Tired, decreased energy  0 0 0 1  Change in appetite  0 0 0 0  Feeling bad or failure about yourself   0 0 0 0  Trouble concentrating  3 3 0 3  Moving slowly or fidgety/restless  0 0 0 0  Suicidal thoughts  0 0 0 0  PHQ-9 Score  6 7 0 7  Difficult doing work/chores  Somewhat difficult  Not difficult at all Somewhat difficult   Hypertension: BP Readings from Last 3 Encounters:  10/20/23 120/68  11/08/22 116/78  10/13/22 116/68   Obesity: Wt Readings from Last 3 Encounters:  10/20/23 146 lb 1.6 oz (66.3 kg)  11/08/22 141 lb 3.2 oz (64 kg)  10/13/22 140 lb (63.5 kg)   BMI Readings from Last 3 Encounters:  10/20/23 28.53 kg/m  11/08/22 27.58 kg/m  10/13/22 27.34 kg/m     Vaccines: reviewed with the patient, will be due to Tdap in 6 months.  Hep C Screening: completed STD testing and prevention (HIV/chl/gon/syphilis): interested in screening today Intimate partner violence: negative screen  Sexual History : active LMP: IUD Mirena placed in 2023, periods are light and irregular  Discussed importance of follow up if any post-menopausal bleeding: NA   Incontinence Symptoms: negative for symptoms   Breast cancer:  - Last Mammogram: NA  Osteoporosis Prevention : Discussed high calcium and vitamin D supplementation, weight bearing exercises Bone density :not applicable   Cervical cancer screening: performing today. 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 Patient does not qualify for screen    Advanced Care Planning: A voluntary discussion about advance care planning including the explanation and discussion of advance directives.  Discussed health care proxy and Living will, and the patient was able to identify a health care proxy as Oneisha Ammons (sister).  Patient does not have a living will and power of attorney of health care   There are no active problems to display for this patient.   Past Surgical History:  Procedure Laterality Date   CHOLECYSTECTOMY      Family History  Problem Relation Age of Onset   Hypertension Mother  Diabetes Mother    Heart attack Mother 73   COPD Maternal Grandmother    Emphysema Maternal Grandmother    Diabetes Paternal Grandfather    Breast cancer Paternal Aunt 41       has contact   Breast cancer Paternal Aunt 70       has contact    Social History   Socioeconomic History   Marital status: Single    Spouse name: Not on file   Number of children: 3   Years of education: Not on file   Highest education level: Some college, no degree  Occupational History   Not on file  Tobacco Use   Smoking status: Never   Smokeless tobacco: Never  Vaping Use   Vaping status: Never Used  Substance and Sexual Activity   Alcohol use: Yes    Alcohol/week: 1.0 standard drink of alcohol    Types: 1 Shots of liquor per week    Drug use: No   Sexual activity: Not Currently    Birth control/protection: I.U.D.    Comment: Mirena  Other Topics Concern   Not on file  Social History Narrative   Has a 33 year old boy, 33 year old boy and daughter 40 years old.   Single mother   Social Drivers of Corporate investment banker Strain: Medium Risk (10/19/2023)   Overall Financial Resource Strain (CARDIA)    Difficulty of Paying Living Expenses: Somewhat hard  Food Insecurity: Food Insecurity Present (10/19/2023)   Hunger Vital Sign    Worried About Running Out of Food in the Last Year: Never true    Ran Out of Food in the Last Year: Sometimes true  Transportation Needs: No Transportation Needs (10/19/2023)   PRAPARE - Administrator, Civil Service (Medical): No    Lack of Transportation (Non-Medical): No  Physical Activity: Sufficiently Active (10/19/2023)   Exercise Vital Sign    Days of Exercise per Week: 6 days    Minutes of Exercise per Session: 90 min  Stress: No Stress Concern Present (10/19/2023)   [employer] of Occupational Health - Occupational Stress Questionnaire    Feeling of Stress : Only a little  Social Connections: Socially Isolated (10/19/2023)   Social Connection and Isolation Panel [NHANES]    Frequency of Communication with Friends and Family: Twice a week    Frequency of Social Gatherings with Friends and Family: Once a week    Attends Religious Services: Never    Database administrator or Organizations: No    Attends Engineer, structural: Not on file    Marital Status: Never married  Intimate Partner Violence: Not At Risk (10/20/2023)   Humiliation, Afraid, Rape, and Kick questionnaire    Fear of Current or Ex-Partner: No    Emotionally Abused: No    Physically Abused: No    Sexually Abused: No     Current Outpatient Medications:    levonorgestrel (MIRENA) 20 MCG/24HR IUD, 1 Intra Uterine Device (1 each total) by Intrauterine route once for 1 dose., Disp: 1 each, Rfl:  0  No Known Allergies   Review of Systems  All other systems reviewed and are negative.    Objective  Vitals:   10/20/23 0912  BP: 120/68  Pulse: 83  Resp: 16  Temp: 98 F (36.7 C)  TempSrc: Oral  SpO2: 99%  Weight: 146 lb 1.6 oz (66.3 kg)  Height: 5' (1.524 m)    Body mass index is 28.53 kg/m.  Physical Exam Exam conducted with a chaperone present.  Constitutional:      Appearance: Normal appearance.  HENT:     Head: Normocephalic and atraumatic.  Eyes:     Conjunctiva/sclera: Conjunctivae normal.  Cardiovascular:     Rate and Rhythm: Normal rate and regular rhythm.  Pulmonary:     Effort: Pulmonary effort is normal.     Breath sounds: Normal breath sounds.  Chest:  Breasts:    Right: Normal.     Left: Normal.  Genitourinary:    Comments: External genitalia within normal limits.  Vaginal mucosa pink, moist, normal rugae.  Nonfriable cervix without lesions, no discharge or bleeding noted on speculum exam.  Bimanual exam revealed normal, nongravid uterus.  No cervical motion tenderness. No adnexal masses bilaterally.    Lymphadenopathy:     Upper Body:     Right upper body: No supraclavicular, axillary or pectoral adenopathy.     Left upper body: No supraclavicular, axillary or pectoral adenopathy.  Skin:    General: Skin is warm and dry.  Neurological:     General: No focal deficit present.     Mental Status: She is alert. Mental status is at baseline.  Psychiatric:        Mood and Affect: Mood normal.        Behavior: Behavior normal.     Last CBC Lab Results  Component Value Date   WBC 7.2 11/08/2022   HGB 12.2 11/08/2022   HCT 36.5 11/08/2022   MCV 84.3 11/08/2022   MCH 28.2 11/08/2022   RDW 12.7 11/08/2022   PLT 384 11/08/2022   Last metabolic panel Lab Results  Component Value Date   GLUCOSE 146 (H) 10/13/2022   NA 142 10/13/2022   K 3.9 10/13/2022   CL 105 10/13/2022   CO2 26 10/13/2022   BUN 20 10/13/2022   CREATININE 0.70  10/13/2022   EGFR 119 10/13/2022   CALCIUM 9.1 10/13/2022   PROT 7.3 10/13/2022   BILITOT 0.5 10/13/2022   AST 20 10/13/2022   ALT 16 10/13/2022   Last lipids Lab Results  Component Value Date   CHOL 146 10/11/2021   HDL 48 (L) 10/11/2021   LDLCALC 82 10/11/2021   TRIG 84 10/11/2021   CHOLHDL 3.0 10/11/2021   Last hemoglobin A1c Lab Results  Component Value Date   HGBA1C 5.4 11/08/2022   Last thyroid functions Lab Results  Component Value Date   TSH 1.18 07/14/2021   Last vitamin D No results found for: "25OHVITD2", "25OHVITD3", "VD25OH" Last vitamin B12 and Folate Lab Results  Component Value Date   VITAMINB12 382 10/09/2020   FOLATE 8.4 10/09/2020    Assessment & Plan  1. Annual physical exam (Primary)/ Lipid screening: Physical exam completed, health maintenance reviewed and annual labs ordered.   - CBC w/Diff/Platelet - COMPLETE METABOLIC PANEL WITH GFR - Lipid Profile  2. Cervical cancer screening/Screening examination for STD (sexually transmitted disease): Repeat Pap today and STD screening ordered.   - Cytology - PAP - HIV antibody (with reflex) - Cervicovaginal ancillary only - RPR  3. Fatigue, unspecified type/Vitamin D deficiency: Will add TSH and vitamins to labs, she is not currently on any supplements. Discussed taking a women's once a day multi-vitamin.   - TSH - Vitamin B12 - Vitamin D (25 hydroxy)   -USPSTF grade A and B recommendations reviewed with patient; age-appropriate recommendations, preventive care, screening tests, etc discussed and encouraged; healthy living encouraged; see AVS for patient education given to  patient -Discussed importance of 150 minutes of physical activity weekly, eat two servings of fish weekly, eat one serving of tree nuts ( cashews, pistachios, pecans, almonds.Marland Kitchen) every other day, eat 6 servings of fruit/vegetables daily and drink plenty of water and avoid sweet beverages.   -Reviewed Health Maintenance: Yes.

## 2023-10-20 ENCOUNTER — Other Ambulatory Visit (HOSPITAL_COMMUNITY)
Admission: RE | Admit: 2023-10-20 | Discharge: 2023-10-20 | Disposition: A | Discharge status: Home / Self Care | Source: Ambulatory Visit | Attending: Internal Medicine | Admitting: Internal Medicine

## 2023-10-20 ENCOUNTER — Encounter: Payer: Self-pay | Admitting: Internal Medicine

## 2023-10-20 ENCOUNTER — Other Ambulatory Visit: Payer: Self-pay

## 2023-10-20 ENCOUNTER — Ambulatory Visit (INDEPENDENT_AMBULATORY_CARE_PROVIDER_SITE_OTHER): Payer: BC Managed Care – PPO | Admitting: Internal Medicine

## 2023-10-20 VITALS — BP 120/68 | HR 83 | Temp 98.0°F | Resp 16 | Ht 60.0 in | Wt 146.1 lb

## 2023-10-20 DIAGNOSIS — R5383 Other fatigue: Secondary | ICD-10-CM

## 2023-10-20 DIAGNOSIS — Z0000 Encounter for general adult medical examination without abnormal findings: Secondary | ICD-10-CM | POA: Insufficient documentation

## 2023-10-20 DIAGNOSIS — Z13220 Encounter for screening for lipoid disorders: Secondary | ICD-10-CM | POA: Diagnosis not present

## 2023-10-20 DIAGNOSIS — Z113 Encounter for screening for infections with a predominantly sexual mode of transmission: Secondary | ICD-10-CM | POA: Diagnosis present

## 2023-10-20 DIAGNOSIS — Z124 Encounter for screening for malignant neoplasm of cervix: Secondary | ICD-10-CM | POA: Diagnosis not present

## 2023-10-20 DIAGNOSIS — E559 Vitamin D deficiency, unspecified: Secondary | ICD-10-CM

## 2023-10-21 LAB — COMPLETE METABOLIC PANEL WITH GFR
AG Ratio: 1.8 (calc) (ref 1.0–2.5)
ALT: 15 U/L (ref 6–29)
AST: 19 U/L (ref 10–30)
Albumin: 4.4 g/dL (ref 3.6–5.1)
Alkaline phosphatase (APISO): 58 U/L (ref 31–125)
BUN: 16 mg/dL (ref 7–25)
CO2: 27 mmol/L (ref 20–32)
Calcium: 9.3 mg/dL (ref 8.6–10.2)
Chloride: 108 mmol/L (ref 98–110)
Creat: 0.67 mg/dL (ref 0.50–0.97)
Globulin: 2.4 g/dL (ref 1.9–3.7)
Glucose, Bld: 91 mg/dL (ref 65–99)
Potassium: 4.3 mmol/L (ref 3.5–5.3)
Sodium: 141 mmol/L (ref 135–146)
Total Bilirubin: 0.6 mg/dL (ref 0.2–1.2)
Total Protein: 6.8 g/dL (ref 6.1–8.1)
eGFR: 119 mL/min/{1.73_m2} (ref >=60)

## 2023-10-21 LAB — CBC WITH DIFFERENTIAL/PLATELET
Absolute Lymphocytes: 1866 {cells}/uL (ref 850–3900)
Absolute Monocytes: 420 {cells}/uL (ref 200–950)
Basophils Absolute: 78 {cells}/uL (ref 0–200)
Basophils Relative: 1.3 %
Eosinophils Absolute: 210 {cells}/uL (ref 15–500)
Eosinophils Relative: 3.5 %
HCT: 36.3 % (ref 35.0–45.0)
Hemoglobin: 11.5 g/dL — ABNORMAL LOW (ref 11.7–15.5)
MCH: 27.7 pg (ref 27.0–33.0)
MCHC: 31.7 g/dL — ABNORMAL LOW (ref 32.0–36.0)
MCV: 87.5 fL (ref 80.0–100.0)
MPV: 10 fL (ref 7.5–12.5)
Monocytes Relative: 7 %
Neutro Abs: 3426 {cells}/uL (ref 1500–7800)
Neutrophils Relative %: 57.1 %
Platelets: 408 10*3/uL — ABNORMAL HIGH (ref 140–400)
RBC: 4.15 10*6/uL (ref 3.80–5.10)
RDW: 12.6 % (ref 11.0–15.0)
Total Lymphocyte: 31.1 %
WBC: 6 10*3/uL (ref 3.8–10.8)

## 2023-10-21 LAB — LIPID PANEL
Cholesterol: 139 mg/dL (ref <200)
HDL: 49 mg/dL — ABNORMAL LOW (ref >=50)
LDL Cholesterol (Calc): 78 mg/dL
Non-HDL Cholesterol (Calc): 90 mg/dL (ref <130)
Total CHOL/HDL Ratio: 2.8 (calc) (ref <5.0)
Triglycerides: 42 mg/dL (ref <150)

## 2023-10-21 LAB — VITAMIN B12: Vitamin B-12: 420 pg/mL (ref 200–1100)

## 2023-10-21 LAB — TSH: TSH: 1.54 m[IU]/L

## 2023-10-21 LAB — VITAMIN D 25 HYDROXY (VIT D DEFICIENCY, FRACTURES): Vit D, 25-Hydroxy: 18 ng/mL — ABNORMAL LOW (ref 30–100)

## 2023-10-21 LAB — HIV ANTIBODY (ROUTINE TESTING W REFLEX): HIV 1&2 Ab, 4th Generation: NONREACTIVE

## 2023-10-21 LAB — RPR: RPR Ser Ql: NONREACTIVE

## 2023-10-23 ENCOUNTER — Other Ambulatory Visit: Payer: Self-pay | Admitting: Internal Medicine

## 2023-10-23 DIAGNOSIS — Z113 Encounter for screening for infections with a predominantly sexual mode of transmission: Secondary | ICD-10-CM

## 2023-10-24 LAB — CERVICOVAGINAL ANCILLARY ONLY
Bacterial Vaginitis (gardnerella): NEGATIVE
Candida Glabrata: NEGATIVE
Candida Vaginitis: NEGATIVE
Chlamydia: NEGATIVE
Comment: NEGATIVE
Comment: NEGATIVE
Comment: NEGATIVE
Comment: NEGATIVE
Comment: NEGATIVE
Comment: NORMAL
Neisseria Gonorrhea: NEGATIVE
Trichomonas: NEGATIVE

## 2023-10-24 MED ORDER — VITAMIN D (ERGOCALCIFEROL) 1.25 MG (50000 UNIT) PO CAPS: 50000.0000 [IU] | ORAL_CAPSULE | ORAL | 0 refills | Status: AC

## 2023-10-24 NOTE — Addendum Note (Signed)
 Addended by: Margarita Mail on: 10/24/2023 12:10 PM   Modules accepted: Orders

## 2023-10-26 ENCOUNTER — Encounter: Payer: Self-pay | Admitting: Internal Medicine

## 2023-10-28 LAB — CYTOLOGY - PAP
Comment: NEGATIVE
Diagnosis: NEGATIVE
High risk HPV: NEGATIVE

## 2023-10-30 ENCOUNTER — Encounter: Payer: Self-pay | Admitting: Internal Medicine

## 2023-11-10 ENCOUNTER — Ambulatory Visit: Admitting: Internal Medicine

## 2023-11-13 ENCOUNTER — Ambulatory Visit: Admitting: Internal Medicine

## 2024-01-09 ENCOUNTER — Other Ambulatory Visit: Payer: Self-pay | Admitting: Internal Medicine

## 2024-01-09 DIAGNOSIS — E559 Vitamin D deficiency, unspecified: Secondary | ICD-10-CM

## 2024-01-11 NOTE — Telephone Encounter (Signed)
 Requested medication (s) are due for refill today: yes  Requested medication (s) are on the active medication list: yes  Last refill:  10/24/23 #12/0  Future visit scheduled: yes  Notes to clinic:  Unable to refill per protocol, cannot delegate.      Requested Prescriptions  Pending Prescriptions Disp Refills   Vitamin D , Ergocalciferol , (DRISDOL ) 1.25 MG (50000 UNIT) CAPS capsule [Pharmacy Med Name: VITAMIN D2 1.25MG (50,000 UNIT)] 12 capsule 0    Sig: Take 1 capsule (50,000 Units total) by mouth every 7 (seven) days.     Endocrinology:  Vitamins - Vitamin D  Supplementation 2 Failed - 01/11/2024  4:52 PM      Failed - Manual Review: Route requests for 50,000 IU strength to the provider      Failed - Vitamin D  in normal range and within 360 days    Vit D, 25-Hydroxy  Date Value Ref Range Status  10/20/2023 18 (L) 30 - 100 ng/mL Final    Comment:    Vitamin D  Status         25-OH Vitamin D : . Deficiency:                    <20 ng/mL Insufficiency:             20 - 29 ng/mL Optimal:                 > or = 30 ng/mL . For 25-OH Vitamin D  testing on patients on  D2-supplementation and patients for whom quantitation  of D2 and D3 fractions is required, the [facility]ure[facility](TM) 25-OH VIT D, (D2,D3), LC/MS/MS is recommended: order  code 45409 (patients >69yrs). . See Note 1 . Note 1 . For additional information, please refer to  http://education.Quest[facility].com/faq/FAQ199  (This link is being provided for informational/ educational purposes only.)          Passed - Ca in normal range and within 360 days    Calcium  Date Value Ref Range Status  10/20/2023 9.3 8.6 - 10.2 mg/dL Final         Passed - Valid encounter within last 12 months    Recent Outpatient Visits           2 months ago Annual physical exam   [facility] Rockney Cid, DO       Future Appointments             In 9 months Rockney Cid, DO [facility]  [facility], [facility]

## 2024-02-14 ENCOUNTER — Ambulatory Visit: Payer: Self-pay

## 2024-02-14 NOTE — Telephone Encounter (Signed)
 FYI Only or Action Required?: FYI only for provider.  Patient was last seen in primary care on 10/20/2023 by Bernardo Fend, DO. Called Nurse Triage reporting Neck Pain. Symptoms began several days ago. Interventions attempted: OTC medications: Advil / Aleve. Symptoms are: unchanged.  Triage Disposition: See PCP When Office is Open (Within 3 Days)  Patient/caregiver understands and will follow disposition?: Yes  **Patient scheduled with PCP for 7/3**                            Copied from CRM [id number]. Topic: Clinical - Red Word Triage >> Feb 14, 2024  1:29 PM Elle L wrote: Red Word that prompted transfer to Nurse Triage: The patient has been experiencing neck pain for 5 days that is causing nausea and shortness of breath. The patient is requesting to see if she should be seen in office or if she could do a MyChart visit. Reason for Disposition  [1] MODERATE neck pain (e.g., interferes with normal activities) AND [2] present > 3 days  Answer Assessment - Initial Assessment Questions 1. ONSET: When did the pain begin?      X 5 days  2. LOCATION: Where does it hurt?      Left of neck  3. PATTERN Does the pain come and go, or has it been constant since it started?       Intermittent, comes back with certain movements  4. SEVERITY: How bad is the pain?  (Scale 1-10; or mild, moderate, severe)   - NO PAIN (0): no pain or only slight stiffness    - MILD (1-3): doesn't interfere with normal activities    - MODERATE (4-7): interferes with normal activities or awakens from sleep    - SEVERE (8-10):  excruciating pain, unable to do any normal activities      7/10  5. RADIATION: Does the pain go anywhere else, shoot into your arms?     No  6. CORD SYMPTOMS: Any weakness or numbness of the arms or legs?     Numbness in left shoulder  7. CAUSE: What do you think is causing the neck pain?     Unknown   8. NECK OVERUSE: Any recent activities that  involved turning or twisting the neck?     No   9. OTHER SYMPTOMS: Do you have any other symptoms? (e.g., headache, fever, chest pain, difficulty breathing, neck swelling)     Dizziness, nausea intermittently   10. PREGNANCY: Is there any chance you are pregnant? When was your last menstrual period?       No  Protocols used: Neck Pain or Stiffness-A-AH

## 2024-02-15 ENCOUNTER — Ambulatory Visit: Admitting: Internal Medicine

## 2024-10-22 ENCOUNTER — Encounter: Admitting: Internal Medicine
